# Patient Record
Sex: Male | Born: 1995 | Race: White | Hispanic: No | Marital: Single | Smoking: Never smoker
Health system: Southern US, Community
[De-identification: ages and names within clinical notes are randomized; demographics above are authoritative.]

## PROBLEM LIST (undated history)

## (undated) DIAGNOSIS — R51 Headache: Secondary | ICD-10-CM

## (undated) DIAGNOSIS — R519 Headache, unspecified: Secondary | ICD-10-CM

## (undated) DIAGNOSIS — L0591 Pilonidal cyst without abscess: Principal | ICD-10-CM

## (undated) DIAGNOSIS — K219 Gastro-esophageal reflux disease without esophagitis: Secondary | ICD-10-CM

## (undated) DIAGNOSIS — J301 Allergic rhinitis due to pollen: Secondary | ICD-10-CM

## (undated) DIAGNOSIS — Z87898 Personal history of other specified conditions: Secondary | ICD-10-CM

## (undated) HISTORY — DX: Allergic rhinitis due to pollen: J30.1

## (undated) HISTORY — DX: Personal history of other specified conditions: Z87.898

## (undated) HISTORY — DX: Gastro-esophageal reflux disease without esophagitis: K21.9

## (undated) HISTORY — DX: Pilonidal cyst without abscess: L05.91

## (undated) HISTORY — PX: TRACHEOSTOMY: SUR1362

## (undated) HISTORY — PX: BACK SURGERY: SHX140

---
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
## 2014-04-14 DIAGNOSIS — L0591 Pilonidal cyst without abscess: Secondary | ICD-10-CM

## 2014-04-14 HISTORY — DX: Pilonidal cyst without abscess: L05.91

## 2014-05-15 ENCOUNTER — Encounter: Payer: Self-pay | Admitting: *Deleted

## 2014-05-22 ENCOUNTER — Ambulatory Visit (INDEPENDENT_AMBULATORY_CARE_PROVIDER_SITE_OTHER): Payer: 59 | Admitting: General Surgery

## 2014-05-22 ENCOUNTER — Encounter: Payer: Self-pay | Admitting: General Surgery

## 2014-05-22 VITALS — BP 122/78 | HR 90 | Resp 15 | Ht 65.0 in | Wt 226.0 lb

## 2014-05-22 DIAGNOSIS — L0501 Pilonidal cyst with abscess: Secondary | ICD-10-CM | POA: Diagnosis not present

## 2014-05-22 MED ORDER — DOXYCYCLINE HYCLATE 100 MG PO CAPS: 100.0000 mg | ORAL_CAPSULE | Freq: Two times a day (BID) | ORAL | Status: DC

## 2014-05-22 NOTE — Progress Notes (Signed)
Patient ID: Samuel Miranda, male   DOB: 1996/03/26, 19 y.o.   MRN: [id number]  Chief Complaint  Patient presents with  . Other    Evaluation for cyst    HPI Samuel RightJeffrey C Schoenberger is a 19 y.o. male  Here for evaluation of Pilonidal cyst. He reports that this area keeps getting infected. He first noticed this about a year ago. He reports this is about the fifth time this has gotten infected. He reports this latest flair up occurred 2 weeks ago. He reports swelling about the size of a baseball and develops a head and drains. He reports that this drained for about 4 days with bloody discharge. He reports no fevers with this.  HPI  History reviewed. No pertinent past medical history.  Past Surgical History  Procedure Laterality Date  . Cystectomy  2012    Bladder tumor removed    Family History  Problem Relation Age of Onset  . Asthma Mother   . Diabetes Mother     Social History History  Substance Use Topics  . Smoking status: Never Smoker   . Smokeless tobacco: Never Used  . Alcohol Use: No    Allergies  Allergen Reactions  . Penicillins Shortness Of Breath and Rash  . Augmentin [Amoxicillin-Pot Clavulanate] Nausea And Vomiting  . Keflex [Cephalexin] Nausea And Vomiting    Current Outpatient Prescriptions  Medication Sig Dispense Refill  . acetaminophen (TYLENOL) 325 MG tablet Take 650 mg by mouth every 6 (six) hours as needed.    Marland Kitchen. ibuprofen (ADVIL,MOTRIN) 200 MG tablet Take 200 mg by mouth every 8 (eight) hours as needed.    . doxycycline (VIBRAMYCIN) 100 MG capsule Take 1 capsule (100 mg total) by mouth 2 (two) times daily. 20 capsule 0   No current facility-administered medications for this visit.    Review of Systems Review of Systems  Constitutional: Negative.   HENT: Negative.   Respiratory: Negative.     Blood pressure 122/78, pulse 90, resp. rate 15, height 5\' 5"  (1.651 m), weight 226 lb (102.513 kg).  Physical Exam Physical Exam  Constitutional: He is  oriented to person, place, and time. He appears well-developed and well-nourished.  Eyes: Conjunctivae are normal. No scleral icterus.  Neck: Neck supple. No thyromegaly present.  Cardiovascular: Normal rate, regular rhythm and normal heart sounds.   Pulmonary/Chest: Effort normal and breath sounds normal.  Abdominal: Soft. Bowel sounds are normal.  Lymphadenopathy:    He has no cervical adenopathy.  Neurological: He is alert and oriented to person, place, and time.  Skin: Skin is warm and dry.       Data Reviewed Note from urgent care  Assessment    Pilonidal sinus and abscess. Appears to be drained.     Plan    Treat with local care to include frequent cleansing and weekly clipping of hair. 10 day course of doxycycline. Reassess in 3-4 weeks. Briefly discussed surgery. Will defer this until summer when he is out of school.        Aleyah Balik G 05/22/2014, 12:03 PM

## 2014-05-22 NOTE — Patient Instructions (Signed)
Pilonidal Cyst A pilonidal cyst occurs when hairs get trapped (ingrown) beneath the skin in the crease between the buttocks over your sacrum (the bone under that crease). Pilonidal cysts are most common in young men with a lot of body hair. When the cyst is ruptured (breaks) or leaking, fluid from the cyst may cause burning and itching. If the cyst becomes infected, it causes a painful swelling filled with pus (abscess). The pus and trapped hairs need to be removed (often by lancing) so that the infection can heal. However, recurrence is common and an operation may be needed to remove the cyst. HOME CARE INSTRUCTIONS   If the cyst was NOT INFECTED:  Keep the area clean and dry. Bathe or shower daily. Wash the area well with a germ-killing soap. Warm tub baths may help prevent infection and help with drainage. Dry the area well with a towel.  Avoid tight clothing to keep area as moisture free as possible.  Keep area between buttocks as free of hair as possible. A depilatory may be used.  If the cyst WAS INFECTED and needed to be drained:  Your caregiver packed the wound with gauze to keep the wound open. This allows the wound to heal from the inside outwards and continue draining.  Return for a wound check in 1 day or as suggested.  If you take tub baths or showers, repack the wound with gauze following them. Sponge baths (at the sink) are a good alternative.  If an antibiotic was ordered to fight the infection, take as directed.  Only take over-the-counter or prescription medicines for pain, discomfort, or fever as directed by your caregiver.  After the drain is removed, use sitz baths for 20 minutes 4 times per day. Clean the wound gently with mild unscented soap, pat dry, and then apply a dry dressing. SEEK MEDICAL CARE IF:   You have increased pain, swelling, redness, drainage, or bleeding from the area.  You have a fever.  You have muscles aches, dizziness, or a general ill  feeling. Document Released: 03/28/2000 Document Revised: 06/23/2011 Document Reviewed: 05/26/2008 [employer] Patient Information 2015 [employer]. This information is not intended to replace advice given to you by your health care provider. Make sure you discuss any questions you have with your health care provider.  

## 2014-05-26 LAB — ANAEROBIC AND AEROBIC CULTURE

## 2014-06-13 ENCOUNTER — Ambulatory Visit: Payer: Self-pay | Admitting: Nurse Practitioner

## 2014-06-15 ENCOUNTER — Ambulatory Visit (INDEPENDENT_AMBULATORY_CARE_PROVIDER_SITE_OTHER): Payer: 59 | Admitting: General Surgery

## 2014-06-15 ENCOUNTER — Encounter: Payer: Self-pay | Admitting: General Surgery

## 2014-06-15 ENCOUNTER — Ambulatory Visit: Payer: 59 | Admitting: General Surgery

## 2014-06-15 VITALS — BP 126/78 | HR 92 | Resp 14 | Ht 65.0 in | Wt 229.0 lb

## 2014-06-15 DIAGNOSIS — L0591 Pilonidal cyst without abscess: Secondary | ICD-10-CM | POA: Diagnosis not present

## 2014-06-15 NOTE — Patient Instructions (Signed)
Pilonidal Cyst A pilonidal cyst occurs when hairs get trapped (ingrown) beneath the skin in the crease between the buttocks over your sacrum (the bone under that crease). Pilonidal cysts are most common in young men with a lot of body hair. When the cyst is ruptured (breaks) or leaking, fluid from the cyst may cause burning and itching. If the cyst becomes infected, it causes a painful swelling filled with pus (abscess). The pus and trapped hairs need to be removed (often by lancing) so that the infection can heal. However, recurrence is common and an operation may be needed to remove the cyst. HOME CARE INSTRUCTIONS   If the cyst was NOT INFECTED:  Keep the area clean and dry. Bathe or shower daily. Wash the area well with a germ-killing soap. Warm tub baths may help prevent infection and help with drainage. Dry the area well with a towel.  Avoid tight clothing to keep area as moisture free as possible.  Keep area between buttocks as free of hair as possible. A depilatory may be used.  If the cyst WAS INFECTED and needed to be drained:  Your caregiver packed the wound with gauze to keep the wound open. This allows the wound to heal from the inside outwards and continue draining.  Return for a wound check in 1 day or as suggested.  If you take tub baths or showers, repack the wound with gauze following them. Sponge baths (at the sink) are a good alternative.  If an antibiotic was ordered to fight the infection, take as directed.  Only take over-the-counter or prescription medicines for pain, discomfort, or fever as directed by your caregiver.  After the drain is removed, use sitz baths for 20 minutes 4 times per day. Clean the wound gently with mild unscented soap, pat dry, and then apply a dry dressing. SEEK MEDICAL CARE IF:   You have increased pain, swelling, redness, drainage, or bleeding from the area.  You have a fever.  You have muscles aches, dizziness, or a general ill  feeling. Document Released: 03/28/2000 Document Revised: 06/23/2011 Document Reviewed: 05/26/2008 [employer] Patient Information 2015 [employer]. This information is not intended to replace advice given to you by your health care provider. Make sure you discuss any questions you have with your health care provider.  

## 2014-06-15 NOTE — Progress Notes (Signed)
He is here today for follow up pilonidal cyst. He states the area is better. No drainage and no pain.  Previous abscess has resolved, large skin opening in middle had several tufts of hair removed.  No sign of redness or induration. Follow up in one month. Continue clipping hair around pilonidal area.

## 2014-06-16 ENCOUNTER — Encounter: Payer: Self-pay | Admitting: Nurse Practitioner

## 2014-06-16 ENCOUNTER — Ambulatory Visit (INDEPENDENT_AMBULATORY_CARE_PROVIDER_SITE_OTHER): Payer: 59 | Admitting: Nurse Practitioner

## 2014-06-16 VITALS — BP 118/78 | HR 84 | Temp 98.4°F | Resp 12 | Ht 65.0 in | Wt 231.1 lb

## 2014-06-16 DIAGNOSIS — Z7189 Other specified counseling: Secondary | ICD-10-CM

## 2014-06-16 DIAGNOSIS — L0591 Pilonidal cyst without abscess: Secondary | ICD-10-CM

## 2014-06-16 DIAGNOSIS — Z7689 Persons encountering health services in other specified circumstances: Secondary | ICD-10-CM

## 2014-06-16 NOTE — Progress Notes (Signed)
Pre visit review using our clinic review tool, if applicable. No additional management support is needed unless otherwise documented below in the visit note. 

## 2014-06-16 NOTE — Progress Notes (Signed)
Subjective:    Patient ID: Samuel Miranda, male    DOB: 1995-04-18, 18 y.o.   MRN: [id number]  HPI  Samuel Miranda is a 19 yo male establishing care today. Patient is accompanied by his mother who is the primary historian.   1) Health Maintenance-   Diet- Eats at home often   Exercise- No formal   Immunizations- Refuses flu   Eye Exam- 2014 November, Appointment soon  Dental Exam- Not UTD  2) Chronic Problems-  Obesity- Plans to start March 17th   Exercise- 1 x daily for 15 min and work way up at home   Food- Cutting back on portions     Allergies- Zyrtec and Benadryl helpful   Pilonidal Cyst- Dr. Evette Cristal   Cystostoscopy- 2014   3) Acute Problems-  Started having Pilonidal cysts about a year and half ago. Recur every few months. Saw Dr. Evette Cristal yesterday for removal of hair tuft in pilonidal cyst. Pt's mother is wondering why surgery is not an option.    Review of Systems  Constitutional: Negative for fever, chills, diaphoresis and fatigue.  HENT: Negative for congestion, ear discharge, ear pain, postnasal drip, rhinorrhea, sinus pressure, sneezing, sore throat, tinnitus, trouble swallowing and voice change.   Eyes: Negative for visual disturbance.  Respiratory: Negative for chest tightness, shortness of breath and wheezing.   Cardiovascular: Negative for chest pain, palpitations and leg swelling.  Gastrointestinal: Negative for nausea, vomiting, diarrhea and rectal pain.  Skin: Negative for rash.  Neurological: Negative for dizziness, weakness and numbness.  Psychiatric/Behavioral: The patient is not nervous/anxious.    Past Medical History  Diagnosis Date  . Pilonidal cyst 2016  . Hay fever   . GERD (gastroesophageal reflux disease)     As a child  . History of learning disability     From childhood events- low oxygenation for short periods of time    History   Social History  . Marital Status: Single    Spouse Name: N/A  . Number of Children: N/A  . Years of  Education: N/A   Occupational History  . Not on file.   Social History Main Topics  . Smoking status: Never Smoker   . Smokeless tobacco: Never Used  . Alcohol Use: No  . Drug Use: No  . Sexual Activity: No   Other Topics Concern  . 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 Cystectomy  2014    cyst Removed from bladder    Family History  Problem Relation Age of Onset  . Asthma Mother   . Diabetes Mother   . Hyperlipidemia Mother   . Hypertension Mother   . Alcohol abuse Father   . Alcohol abuse Maternal Grandmother   . COPD Maternal Grandmother   . Cancer Maternal Aunt 51    Breast Cancer  . Diabetes Maternal Uncle   . COPD Maternal Grandfather   . Hypertension Maternal Grandfather   . Diabetes Paternal Grandmother   . Stroke Paternal Grandmother   . Heart disease Paternal Grandfather   . COPD Paternal Grandfather     Allergies  Allergen Reactions  .  Penicillins Shortness Of Breath and Rash  . Augmentin [Amoxicillin-Pot Clavulanate] Nausea And Vomiting  . Keflex [Cephalexin] Nausea And Vomiting    Current Outpatient Prescriptions on File Prior to Visit  Medication Sig Dispense Refill  . acetaminophen (TYLENOL) 325 MG tablet Take 650 mg by mouth every 6 (six) hours as needed.    Marland Kitchen. ibuprofen (ADVIL,MOTRIN) 200 MG tablet Take 200 mg by mouth every 8 (eight) hours as needed.     No current facility-administered medications on file prior to visit.      Objective:   Physical Exam  Constitutional: He is oriented to person, place, and time. He appears well-developed and well-nourished. No distress.  BP 118/78 mmHg  Pulse 84  Temp(Src) 98.4 F (36.9 C) (Oral)  Resp 12  Ht 5\' 5"   (1.651 m)  Wt 231 lb 1.9 oz (104.835 kg)  BMI 38.46 kg/m2  SpO2 97%   HENT:  Head: Normocephalic and atraumatic.  Right Ear: External ear normal.  Left Ear: External ear normal.  Eyes: Right eye exhibits no discharge. Left eye exhibits no discharge. No scleral icterus.  Cardiovascular: Normal rate, regular rhythm, normal heart sounds and intact distal pulses.  Exam reveals no gallop and no friction rub.   No murmur heard. Pulmonary/Chest: Effort normal and breath sounds normal. No respiratory distress. He has no wheezes. He has no rales. He exhibits no tenderness.  Genitourinary:  Just saw Samuel Miranda yesterday- deferred evaluation  Neurological: He is alert and oriented to person, place, and time.  Skin: Skin is warm and dry. No rash noted. He is not diaphoretic.  Psychiatric: He has a normal mood and affect.  Pt has a learning disability.       Assessment & Plan:

## 2014-06-16 NOTE — Patient Instructions (Addendum)
Continue to follow up with Dr. Evette CristalSankar next month.   Welcome to [facility]!

## 2014-06-21 DIAGNOSIS — L0591 Pilonidal cyst without abscess: Secondary | ICD-10-CM | POA: Insufficient documentation

## 2014-06-21 DIAGNOSIS — Z7689 Persons encountering health services in other specified circumstances: Secondary | ICD-10-CM | POA: Insufficient documentation

## 2014-06-21 NOTE — Assessment & Plan Note (Signed)
Discussed acute and chronic issues. Reviewed health maintenance measures, PFSHx, and immunizations. Obtain records from past facility.

## 2014-06-21 NOTE — Assessment & Plan Note (Signed)
Pt sees Dr. Evette CristalSankar for care regarding the pilonidal cyst. The aerobic anaerobic culture came back negative for infection in Feb. 2016. The mother is concerned about having it surgically "fixed" so that her son will not have to miss work. I explained that it was not infected and that Dr. Luan MooreSankar's instructions were to keep the area clean and hair free, which will keep the site from becoming inflamed once again. She was agreeable to this and they will continue to follow up as requested.

## 2014-07-19 ENCOUNTER — Ambulatory Visit: Payer: 59 | Admitting: General Surgery

## 2014-10-27 ENCOUNTER — Telehealth: Payer: Self-pay | Admitting: Nurse Practitioner

## 2014-10-27 NOTE — Telephone Encounter (Signed)
Pt mother- Samuel Miranda called to advise that pt has a cyst at his tailbone that is leaking. Please advise pt.msn

## 2014-10-27 NOTE — Telephone Encounter (Signed)
Informed grandmother that he had to go to the general surgeon's office first

## 2014-10-27 NOTE — Telephone Encounter (Signed)
He will need to call his general surgeon's office first.

## 2014-11-30 ENCOUNTER — Emergency Department
Admission: EM | Admit: 2014-11-30 | Discharge: 2014-11-30 | Disposition: A | Discharge status: Home / Self Care | Payer: 59 | Attending: Student | Admitting: Student

## 2014-11-30 ENCOUNTER — Encounter: Payer: Self-pay | Admitting: Emergency Medicine

## 2014-11-30 DIAGNOSIS — L0501 Pilonidal cyst with abscess: Secondary | ICD-10-CM | POA: Insufficient documentation

## 2014-11-30 DIAGNOSIS — Z880 Allergy status to penicillin: Secondary | ICD-10-CM | POA: Diagnosis not present

## 2014-11-30 DIAGNOSIS — L02212 Cutaneous abscess of back [any part, except buttock]: Secondary | ICD-10-CM | POA: Diagnosis present

## 2014-11-30 DIAGNOSIS — Z79899 Other long term (current) drug therapy: Secondary | ICD-10-CM | POA: Insufficient documentation

## 2014-11-30 MED ORDER — SULFAMETHOXAZOLE-TRIMETHOPRIM 800-160 MG PO TABS: 1.0000 | ORAL_TABLET | Freq: Two times a day (BID) | ORAL | Status: DC

## 2014-11-30 MED ORDER — OXYCODONE-ACETAMINOPHEN 5-325 MG PO TABS
2.0000 | ORAL_TABLET | Freq: Once | ORAL | Status: AC
  Administered 2014-11-30: 2 via ORAL
  Filled 2014-11-30: qty 2

## 2014-11-30 MED ORDER — OXYCODONE-ACETAMINOPHEN 5-325 MG PO TABS: 1.0000 | ORAL_TABLET | ORAL | Status: DC | PRN

## 2014-11-30 MED ORDER — LIDOCAINE-EPINEPHRINE (PF) 1 %-1:200000 IJ SOLN
INTRAMUSCULAR | Status: AC
  Filled 2014-11-30: qty 30

## 2014-11-30 MED ORDER — LIDOCAINE HCL (PF) 1 % IJ SOLN
5.0000 mL | Freq: Once | INTRAMUSCULAR | Status: AC
  Administered 2014-11-30: 5 mL
  Filled 2014-11-30: qty 5

## 2014-11-30 NOTE — ED Notes (Signed)
Patient reports abscess to lower back.

## 2014-11-30 NOTE — Discharge Instructions (Signed)
° ° °  RETURN TO ER IN 2 DAYS FOR REMOVAL OF PACKING PERCOCET FOR PAIN AS NEEDED SEPTRA DS FOR INFECTION FOR 10 DAYS CALL FOR AN APPOINTMENT WITH THE SURGEON LISTED ON YOUR DISCHARGE PAPERS

## 2014-11-30 NOTE — ED Provider Notes (Signed)
[facility] Emergency Department Provider Note  ____________________________________________  Time seen: Approximately 11:35 AM  I have reviewed the triage vital signs and the nursing notes.   HISTORY  Chief Complaint Abscess   HPI Samuel Miranda is a 19 y.o. male is here with complaint of abscess to lower back.He states that this is the eighth time he has had an abscess in this area. It has been cultured twice and was negative for MRSA. She denies any fever at home. Patient states that mother drained it this morning and he was able to sleep. Mother states that she got approximately 1/2 cup of drainage from the area since it has a hole in it. Patient states that he drained even more after that. He continues to have pain in this area. Mother would like to get a referral to a surgeon for a second opinion. They were at his surgeon's office in the past who was reluctant to remove the cyst that he continues to have more infections. Currently he states his pain is 4 out of 10. Pain increases if he is trying to sit.   Past Medical History  Diagnosis Date  . Pilonidal cyst 2016  . Hay fever   . GERD (gastroesophageal reflux disease)     As a child  . History of learning disability     From childhood events- low oxygenation for short periods of time    Patient Active Problem List   Diagnosis Date Noted  . Encounter to establish care 06/21/2014  . Pilonidal cyst without infection 06/21/2014    Past Surgical History  Procedure Laterality Date  . 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 Cystectomy  2014    cyst Removed from bladder    Current Outpatient Rx  Name  Route  Sig  Dispense  Refill  . acetaminophen (TYLENOL) 325 MG tablet   Oral   Take 650 mg by mouth every 6 (six) hours as needed.         Marland Kitchen ibuprofen (ADVIL,MOTRIN) 200 MG tablet   Oral   Take 200 mg by mouth every 8 (eight) hours as needed.         Marland Kitchen oxyCODONE-acetaminophen (PERCOCET)  5-325 MG per tablet   Oral   Take 1 tablet by mouth every 4 (four) hours as needed for severe pain.   20 tablet   0   . sulfamethoxazole-trimethoprim (BACTRIM DS,SEPTRA DS) 800-160 MG per tablet   Oral   Take 1 tablet by mouth 2 (two) times daily.   20 tablet   0     Allergies Penicillins; Augmentin; and Keflex  Family History  Problem Relation Age of Onset  . Asthma Mother   . Diabetes Mother   . Hyperlipidemia Mother   . Hypertension Mother   . Alcohol abuse Father   . Alcohol abuse Maternal Grandmother   . COPD Maternal Grandmother   . Cancer Maternal Aunt 22    Breast Cancer  . Diabetes Maternal Uncle   . COPD Maternal Grandfather   . Hypertension Maternal Grandfather   . Diabetes Paternal Grandmother   . Stroke Paternal Grandmother   . Heart disease Paternal Grandfather   . COPD Paternal Grandfather     Social History Social History  Substance Use Topics  . Smoking status: Never Smoker   . Smokeless tobacco: Never Used  . Alcohol Use: No    Review of Systems Constitutional: No fever/chills Eyes: No visual changes. ENT: No  sore throat. Cardiovascular: Denies chest pain. Respiratory: Denies shortness of breath. Gastrointestinal: No abdominal pain.  No nausea, no vomiting.  No diarrhea.  No constipation. Genitourinary: Negative for dysuria. Musculoskeletal: Negative for back pain. Skin: Negative for rash. Positive for multiple abscesses Neurological: Negative for headaches, focal weakness or numbness.  10-point ROS otherwise negative.  ____________________________________________   PHYSICAL EXAM:  VITAL SIGNS: ED Triage Vitals  Enc Vitals Group     BP 11/30/14 1030 140/90 mmHg     Pulse Rate 11/30/14 1030 102     Resp 11/30/14 1030 20     Temp 11/30/14 1030 98.5 F (36.9 C)     Temp Source 11/30/14 1030 Oral     SpO2 11/30/14 1030 98 %     Weight 11/30/14 1030 235 lb (106.595 kg)     Height 11/30/14 1030 5\' 5"  (1.651 m)     Head Cir --       Peak Flow --      Pain Score 11/30/14 1031 4     Pain Loc --      Pain Edu? --      Excl. in GC? --     Constitutional: Alert and oriented. Well appearing and in no acute distress. Eyes: Conjunctivae are normal. PERRL. EOMI. Head: Atraumatic. Nose: No congestion/rhinnorhea. Neck: No stridor.   Cardiovascular: Normal rate, regular rhythm. Grossly normal heart sounds.  Good peripheral circulation. Respiratory: Normal respiratory effort.  No retractions. Lungs CTAB. Gastrointestinal: Soft and nontender. No distention Musculoskeletal: No lower extremity tenderness nor edema.  No joint effusions. Neurologic:  Normal speech and language. No gross focal neurologic deficits are appreciated. No gait instability. Skin:  Skin is warm, dry and intact. There is a large, mild cyst with erythema present. Moderate tenderness on palpation. There is 2 areas for which there is moderate drainage. Psychiatric: Mood and affect are normal. Speech and behavior are normal.  ____________________________________________   LABS (all labs ordered are listed, but only abnormal results are displayed)  Labs Reviewed - No data to display  PROCEDURES  Procedure(s) performed: INCISION AND DRAINAGE Performed by: Tommi Rumps Consent: Verbal consent obtained. Risks and benefits: risks, benefits and alternatives were discussed Type: abscess  Body area: Perirectal/pilonidal  Anesthesia: local infiltration  Incision was made with a scalpel.  Local anesthetic: lidocaine and % without epinephrine  Anesthetic total: 4.0 ml  Complexity: complex Blunt dissection to break up loculations  Drainage: purulent   Drainage amount: Small amount   Packing material: 1/4 in iodoform gauze  Patient tolerance: Patient tolerated the procedure well with no immediate complications.    Critical Care performed: No  ____________________________________________   INITIAL IMPRESSION / ASSESSMENT AND PLAN / ED  COURSE  Pertinent labs & imaging results that were available during my care of the patient were reviewed by me and considered in my medical decision making (see chart for detaila    Patient was started on Septra DS for 10 days and prescription for Percocet as needed for pain. Mother was given the name of the surgeon on-call for follow-up and hopefully for excision of the cyst as this is numerous infections. She is return in 2 days with patient for packing removal.    FINAL CLINICAL IMPRESSION(S) / ED DIAGNOSES  Final diagnoses:  Pilonidal cyst with abscess      Tommi Rumps, PA-C 11/30/14 1649  Gayla Doss, MD 11/30/14 1714

## 2014-12-02 ENCOUNTER — Emergency Department
Admission: EM | Admit: 2014-12-02 | Discharge: 2014-12-02 | Disposition: A | Discharge status: Home / Self Care | Payer: 59 | Attending: Emergency Medicine | Admitting: Emergency Medicine

## 2014-12-02 ENCOUNTER — Encounter: Payer: Self-pay | Admitting: Emergency Medicine

## 2014-12-02 DIAGNOSIS — Z880 Allergy status to penicillin: Secondary | ICD-10-CM | POA: Insufficient documentation

## 2014-12-02 DIAGNOSIS — Z48817 Encounter for surgical aftercare following surgery on the skin and subcutaneous tissue: Secondary | ICD-10-CM | POA: Diagnosis present

## 2014-12-02 DIAGNOSIS — Z79899 Other long term (current) drug therapy: Secondary | ICD-10-CM | POA: Diagnosis not present

## 2014-12-02 DIAGNOSIS — Z5189 Encounter for other specified aftercare: Secondary | ICD-10-CM

## 2014-12-02 NOTE — ED Notes (Signed)
Pt here for packing removal. Seen previously for abscess on bottom.

## 2014-12-02 NOTE — Discharge Instructions (Signed)
Wound Check Your wound appears healthy today. Your wound will heal gradually over time. Eventually a scar will form that will fade with time. FACTORS THAT AFFECT SCAR FORMATION:  People differ in the severity in which they scar.  Scar severity varies according to location, size, and the traits you inherited from your parents (genetic predisposition).  Irritation to the wound from infection, rubbing, or chemical exposure will increase the amount of scar formation. HOME CARE INSTRUCTIONS   If you were given a dressing, you should change it at least once a day or as instructed by your caregiver. If the bandage sticks, soak it off with a solution of hydrogen peroxide.  If the bandage becomes wet, dirty, or develops a bad smell, change it as soon as possible.  Look for signs of infection.  Only take over-the-counter or prescription medicines for pain, discomfort, or fever as directed by your caregiver. SEEK IMMEDIATE MEDICAL CARE IF:   You have redness, swelling, or increasing pain in the wound.  You notice pus coming from the wound.  You have a fever.  You notice a bad smell coming from the wound or dressing. Document Released: 01/05/2004 Document Revised: 06/23/2011 Document Reviewed: 03/31/2005 [facility] Patient Information 2015 [place], [place]. This information is not intended to replace advice given to you by your health care provider. Make sure you discuss any questions you have with your health care provider.   Continue antibiotic and pain medicine.  See the surgeon as scheduled

## 2014-12-02 NOTE — ED Provider Notes (Signed)
[facility] Emergency Department Provider Note ?____________________________________________ ? Time seen: 1650 ? I have reviewed the triage vital signs and the nursing notes. ________ HISTORY ? Chief Complaint Wound Check  HPI  Samuel Miranda is a 19 y.o. male reports to the ED for wound check status post a recent visit for incision and drainage of a pilonidal cyst abscess. He reports that his symptoms have improved and his pain has been decreased since the visit. He has tolerated his Bactrim as well as his Vicodin pain medicines. He scheduled to see the general surgeon on Wednesday.  Past Medical History  Diagnosis Date  . Pilonidal cyst 2016  . Hay fever   . GERD (gastroesophageal reflux disease)     As a child  . History of learning disability     From childhood events- low oxygenation for short periods of time    Patient Active Problem List   Diagnosis Date Noted  . Encounter to establish care 06/21/2014  . Pilonidal cyst without infection 06/21/2014   ? Past Surgical History  Procedure Laterality Date  . 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 Cystectomy  2014    cyst Removed from bladder   ? Current Outpatient Rx  Name  Route  Sig  Dispense  Refill  . acetaminophen (TYLENOL) 325 MG tablet   Oral   Take 650 mg by mouth every 6 (six) hours as needed.         Marland Kitchen ibuprofen (ADVIL,MOTRIN) 200 MG tablet   Oral   Take 200 mg by mouth every 8 (eight) hours as needed.         Marland Kitchen oxyCODONE-acetaminophen (PERCOCET) 5-325 MG per tablet   Oral   Take 1 tablet by mouth every 4 (four) hours as needed for severe pain.   20 tablet   0   . sulfamethoxazole-trimethoprim (BACTRIM DS,SEPTRA DS) 800-160 MG per tablet   Oral   Take 1 tablet by mouth 2 (two) times daily.   20 tablet   0   ? Allergies Penicillins; Augmentin; and Keflex ? Family History  Problem Relation Age of Onset  . Asthma Mother   . Diabetes Mother   . Hyperlipidemia  Mother   . Hypertension Mother   . Alcohol abuse Father   . Alcohol abuse Maternal Grandmother   . COPD Maternal Grandmother   . Cancer Maternal Aunt 88    Breast Cancer  . Diabetes Maternal Uncle   . COPD Maternal Grandfather   . Hypertension Maternal Grandfather   . Diabetes Paternal Grandmother   . Stroke Paternal Grandmother   . Heart disease Paternal Grandfather   . COPD Paternal Grandfather    ?Social History Social History  Substance Use Topics  . Smoking status: Never Smoker   . Smokeless tobacco: Never Used  . Alcohol Use: No   Review of Systems Constitutional: Negative for fever. HEENT:  Normocephalic/atraumatic. Negative for visual/hearingchanges, sore throat, or nasal congestion. Cardiovascular: Negative for chest pain. Respiratory: Negative for shortness of breath. Musculoskeletal: Negative for back pain. Skin: Reports healing abscess Neurological: Negative for headaches, focal weakness or numbness. Hematological/Lymphatic:Negative for enlarged lymph nodes  10-point ROS otherwise negative. ____________________________________________  PHYSICAL EXAM:  VITAL SIGNS: ED Triage Vitals  Enc Vitals Group     BP 12/02/14 1533 140/80 mmHg     Pulse Rate 12/02/14 1533 101     Resp --      Temp 12/02/14 1533 98.2 F (36.8 C)  Temp Source 12/02/14 1533 Oral     SpO2 12/02/14 1533 100 %     Weight 12/02/14 1533 235 lb (106.595 kg)     Height 12/02/14 1533  (1.651 m)     Head Cir --      Peak Flow --      Pain Score 12/02/14 1534 0     Pain Loc --      Pain Edu? --      Excl. in GC? --    Constitutional: Alert and oriented. Well appearing and in no distress. HEENT: Normocephalic and atraumatic.Conjunctivae are normal. PERRL. Normal extraocular movements. Mucous membranes are moist. Hematological/Lymphatic/Immunilogical: No cervical lymphadenopathy. Cardiovascular: Normal rate, regular rhythm.No murmurs, rubs, or gallops.  Respiratory: Normal  respiratory effort.  Gastrointestinal: Soft and nontender. No distention.  Musculoskeletal: Nontender with normal range of motion in all extremities.  Neurologic:  Normal speech and language. No gross focal neurologic deficits are appreciated.  Skin:  Well-healing abscess at the top of the gluteal cleft, with sterile packing in place. Wound packing is removed without difficulty.  Psychiatric: Mood and affect are normal. Speech and behavior are normal. Patient exhibits appropriate insight and judgment.  ______________________________________________________ INITIAL IMPRESSION / ASSESSMENT AND PLAN / ED COURSE ? Well-healing pilonidal cyst abscess s/p I&D. Dry dressing applied. Continue meds as prescribed. See general surgeon as scheduled. Return as needed.  ____________________________________________ FINAL CLINICAL IMPRESSION(S) / ED DIAGNOSES?  Final diagnoses:  Wound check, abscess      Lissa Hoard, PA-C 12/03/14 2210  Jene Every, MD 12/04/14 1316

## 2014-12-06 ENCOUNTER — Encounter: Payer: Self-pay | Admitting: Surgery

## 2014-12-06 ENCOUNTER — Ambulatory Visit (INDEPENDENT_AMBULATORY_CARE_PROVIDER_SITE_OTHER): Payer: 59 | Admitting: Surgery

## 2014-12-06 VITALS — BP 140/81 | HR 117 | Temp 98.3°F | Ht 65.0 in | Wt 233.0 lb

## 2014-12-06 DIAGNOSIS — L0591 Pilonidal cyst without abscess: Secondary | ICD-10-CM

## 2014-12-06 NOTE — Patient Instructions (Signed)
Follow-up in 4 weeks. Please make an appointment at the front desk.

## 2014-12-06 NOTE — Progress Notes (Signed)
Patient ID: Samuel Miranda, male   DOB: 01/11/1996, 19 y.o.   MRN: [id number]  Chief Complaint  Patient presents with  . Cyst    pilonidal cyst removal surgical consult    HPI  Samuel Miranda is a 19 y.o. male.  Who is referred by urgent care with a 3 year history of multiple episodes of pilonidal cyst abscess requiring multiple trips to the emergency room urgent care for incision and drainage. Quite tired of this process. He has finished college. Patient does not smoke. Currently he is feeling better following the most recent incision and drainage performed on 11/30/2014.  He is accompanied today by his mother.  Past Medical History  Diagnosis Date  . Pilonidal cyst 2016  . Hay fever   . GERD (gastroesophageal reflux disease)     As a child  . History of learning disability     From childhood events- low oxygenation for short periods of time    Past Surgical History  Procedure Laterality Date  . 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 Cystectomy  2014    cyst Removed from bladder    Family History  Problem Relation Age of Onset  . Asthma Mother   . Diabetes Mother   . Hyperlipidemia Mother   . Hypertension Mother   . Alcohol abuse Father   . Alcohol abuse Maternal Grandmother   . COPD Maternal Grandmother   . Cancer Maternal Aunt 59    Breast Cancer  . Diabetes Maternal Uncle   . COPD Maternal Grandfather   . Hypertension Maternal Grandfather   . Diabetes Paternal Grandmother   . Stroke Paternal Grandmother   . Heart disease Paternal Grandfather   . COPD Paternal Grandfather     Social History Social History  Substance Use Topics  . Smoking status: Never Smoker   . Smokeless tobacco: Never Used  . Alcohol Use: No    Allergies  Allergen Reactions  . Penicillins Shortness Of Breath and Rash  . Augmentin [Amoxicillin-Pot Clavulanate] Nausea And Vomiting  . Keflex [Cephalexin] Nausea And Vomiting    Current Outpatient Prescriptions  Medication Sig  Dispense Refill  . acetaminophen (TYLENOL) 325 MG tablet Take 650 mg by mouth every 6 (six) hours as needed.    Marland Kitchen ibuprofen (ADVIL,MOTRIN) 200 MG tablet Take 200 mg by mouth every 8 (eight) hours as needed.    Marland Kitchen oxyCODONE-acetaminophen (PERCOCET) 5-325 MG per tablet Take 1 tablet by mouth every 4 (four) hours as needed for severe pain. 20 tablet 0  . sulfamethoxazole-trimethoprim (BACTRIM DS,SEPTRA DS) 800-160 MG per tablet Take 1 tablet by mouth 2 (two) times daily. 20 tablet 0   No current facility-administered medications for this visit.    Blood pressure 140/81, pulse 117, temperature 98.3 F (36.8 C), temperature source Oral, height  (1.651 m), weight 233 lb (105.688 kg).  Review of Systems  Constitutional: Negative for fever, chills and weight loss.  Gastrointestinal: Negative.   Genitourinary: Negative.   Musculoskeletal: Negative.   All other systems reviewed and are negative.   Physical Exam  Constitutional: He is oriented to person, place, and time and well-developed, well-nourished, and in no distress. Vital signs are normal. No distress.  Cardiovascular: Normal rate and regular rhythm.   Pulmonary/Chest: Effort normal and breath sounds normal.  Abdominal: Soft.  Neurological: He is oriented to person, place, and time.  Skin: Skin is warm and dry. He is not diaphoretic.     Psychiatric:  Mood, memory, affect and judgment normal.     Data Reviewed Outside records were reviewed.  Assessment    Recurrent pilonidal abscess.    Plan    He is quite tired of this problem. I discussed with him surgical intervention in the near future. Him and his mother were agreeable. I like to see him back in 3-4 weeks' time after the induration swelling has reduced to discuss the best surgical approach.     Natale Lay MD, FACS 12/06/2014, 2:10 PM

## 2015-01-10 ENCOUNTER — Ambulatory Visit: Payer: Self-pay | Admitting: Surgery

## 2015-01-31 ENCOUNTER — Ambulatory Visit: Payer: 59 | Admitting: Surgery

## 2015-05-05 ENCOUNTER — Encounter: Payer: Self-pay | Admitting: Emergency Medicine

## 2015-05-05 ENCOUNTER — Ambulatory Visit
Admission: EM | Admit: 2015-05-05 | Discharge: 2015-05-05 | Disposition: A | Discharge status: Home / Self Care | Payer: 59 | Attending: Family Medicine | Admitting: Family Medicine

## 2015-05-05 DIAGNOSIS — L0501 Pilonidal cyst with abscess: Secondary | ICD-10-CM | POA: Diagnosis not present

## 2015-05-05 MED ORDER — SULFAMETHOXAZOLE-TRIMETHOPRIM 800-160 MG PO TABS: 1.0000 | ORAL_TABLET | Freq: Two times a day (BID) | ORAL | Status: AC

## 2015-05-05 MED ORDER — OXYCODONE-ACETAMINOPHEN 5-325 MG PO TABS: 1.0000 | ORAL_TABLET | Freq: Three times a day (TID) | ORAL | Status: DC | PRN

## 2015-05-05 NOTE — ED Notes (Signed)
Patient has an abscess above his buttock off and on for 2 years.  Patient denies fevers.

## 2015-05-05 NOTE — ED Provider Notes (Signed)
CSN: [id number]     Arrival date & time 05/05/15  1122 History   First MD Initiated Contact with Patient 05/05/15 1203     Chief Complaint  Patient presents with  . Abscess   (Consider location/radiation/quality/duration/timing/severity/associated sxs/prior Treatment) HPI Patient resents today with mother with history of pilonidal cyst/abscess. Patient states that he has been dealing with flares of this for the last couple years. He denies any fevers. He denies any extension to the anal area or genital area. He has been doing sitz baths and also warm compresses on the area. It did start to drain yesterday. There was some pus and blood that has started to drain from the area. He has seen surgeons in the past but has not had surgery of the site yet. He states his pain is better since the area has started to drain.  Past Medical History  Diagnosis Date  . Pilonidal cyst 2016  . Hay fever   . GERD (gastroesophageal reflux disease)     As a child  . History of learning disability     From childhood events- low oxygenation for short periods of time   Past Surgical History  Procedure Laterality Date  . 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 Cystectomy  2014    cyst Removed from bladder   Family History  Problem Relation Age of Onset  . Asthma Mother   . Diabetes Mother   . Hyperlipidemia Mother   . Hypertension Mother   . Alcohol abuse Father   . Alcohol abuse Maternal Grandmother   . COPD Maternal Grandmother   . Cancer Maternal Aunt 42    Breast Cancer  . Diabetes Maternal Uncle   . COPD Maternal Grandfather   . Hypertension Maternal Grandfather   . Diabetes Paternal Grandmother   . Stroke Paternal Grandmother   . Heart disease Paternal Grandfather   . COPD Paternal Grandfather    Social History  Substance Use Topics  . Smoking status: Never Smoker   . Smokeless tobacco: Never Used  . Alcohol Use: No    Review of Systems: Negative except mentioned above.    Allergies  Penicillins; Augmentin; and Keflex  Home Medications   Prior to Admission medications   Medication Sig Start Date End Date Taking? Authorizing Provider  acetaminophen (TYLENOL) 325 MG tablet Take 650 mg by mouth every 6 (six) hours as needed.    Historical Provider, MD  ibuprofen (ADVIL,MOTRIN) 200 MG tablet Take 200 mg by mouth every 8 (eight) hours as needed.    Historical Provider, MD  oxyCODONE-acetaminophen (PERCOCET/ROXICET) 5-325 MG tablet Take 1 tablet by mouth every 8 (eight) hours as needed for moderate pain or severe pain. 05/05/15   Jolene Provost, MD  sulfamethoxazole-trimethoprim (BACTRIM DS,SEPTRA DS) 800-160 MG tablet Take 1 tablet by mouth 2 (two) times daily. 05/05/15 05/12/15  Jolene Provost, MD   Meds Ordered and Administered this Visit  Medications - No data to display  BP 128/69 mmHg  Pulse 100  Temp(Src) 98.4 F (36.9 C) (Tympanic)  Resp 16  Ht  (1.676 m)  Wt 236 lb (107.049 kg)  BMI 38.11 kg/m2  SpO2 99% No data found.   Physical Exam  GENERAL: mild discomfort laying on exam table  RESP: CTA B CARD: RRR SKIN:  golf ball sized pilonidal cyst/abscess, there is blood draining from the distal aspect of the area, the area is fluctuant and mildly tender  ED Course  Procedures (including critical care  time)   MDM   1. Pilonidal cyst with abscess   Sterile technique was used and 1/4 inch packing was placed where the area has already started draining, dressing was placed over the area, I have asked that he keep the area clean and dry and use light massage and warm compresses to continue to express the site, he should follow-up here in 2 days or with the general surgeon. I have started him on Bactrim DS for 10 days. He is given Percocet for moderate/severe pain.    Jolene Provost, MD 05/05/15 1252

## 2015-05-20 ENCOUNTER — Encounter: Payer: Self-pay | Admitting: Gynecology

## 2015-05-20 ENCOUNTER — Ambulatory Visit
Admission: EM | Admit: 2015-05-20 | Discharge: 2015-05-20 | Disposition: A | Discharge status: Home / Self Care | Payer: 59 | Attending: Family Medicine | Admitting: Family Medicine

## 2015-05-20 DIAGNOSIS — L0591 Pilonidal cyst without abscess: Secondary | ICD-10-CM

## 2015-05-20 MED ORDER — SULFAMETHOXAZOLE-TRIMETHOPRIM 800-160 MG PO TABS: 1.0000 | ORAL_TABLET | Freq: Two times a day (BID) | ORAL | Status: AC

## 2015-05-20 NOTE — ED Provider Notes (Signed)
CSN: [id number]     Arrival date & time 05/20/15  1426 History   First MD Initiated Contact with Patient 05/20/15 1600     Chief Complaint  Patient presents with  . Cyst   (Consider location/radiation/quality/duration/timing/severity/associated sxs/prior Treatment) HPI: Patient presents today with persistent pilonidal cyst/abscess. The area was drained and packed on 05/05/15. The patient did not follow-up here for repacking as instructed. He kept the packing in for a few days and then his mother removed the packing. He continued to take the antibiotic and states that the area did improve but now continues to still drain and he is finished with the antibiotic course. He denies any fever or any other lesions that are similar anywhere else.  Past Medical History  Diagnosis Date  . Pilonidal cyst 2016  . Hay fever   . GERD (gastroesophageal reflux disease)     As a child  . History of learning disability     From childhood events- low oxygenation for short periods of time   Past Surgical History  Procedure Laterality Date  . 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 Cystectomy  2014    cyst Removed from bladder   Family History  Problem Relation Age of Onset  . Asthma Mother   . Diabetes Mother   . Hyperlipidemia Mother   . Hypertension Mother   . Alcohol abuse Father   . Alcohol abuse Maternal Grandmother   . COPD Maternal Grandmother   . Cancer Maternal Aunt 39    Breast Cancer  . Diabetes Maternal Uncle   . COPD Maternal Grandfather   . Hypertension Maternal Grandfather   . Diabetes Paternal Grandmother   . Stroke Paternal Grandmother   . Heart disease Paternal Grandfather   . COPD Paternal Grandfather    Social History  Substance Use Topics  . Smoking status: Never Smoker   . Smokeless tobacco: Never Used  . Alcohol Use: No    Review of Systems: Negative except mentioned above  Allergies  Penicillins; Augmentin; and Keflex  Home Medications   Prior to  Admission medications   Medication Sig Start Date End Date Taking? Authorizing Provider  acetaminophen (TYLENOL) 325 MG tablet Take 650 mg by mouth every 6 (six) hours as needed.   Yes Historical Provider, MD  ibuprofen (ADVIL,MOTRIN) 200 MG tablet Take 200 mg by mouth every 8 (eight) hours as needed.   Yes Historical Provider, MD  oxyCODONE-acetaminophen (PERCOCET/ROXICET) 5-325 MG tablet Take 1 tablet by mouth every 8 (eight) hours as needed for moderate pain or severe pain. 05/05/15  Yes Jolene Provost, MD   Meds Ordered and Administered this Visit  Medications - No data to display  BP 133/75 mmHg  Pulse 99  Temp(Src) 97.5 F (36.4 C) (Oral)  Resp 18  SpO2 97% No data found.   Physical Exam   GENERAL: NAD SKIN: Gluteal Cleft- quarter sized area of induration, still draining, no streaks, mild tenderness   ED Course  Procedures (including critical care time)  Labs Review Labs Reviewed - No data to display  Imaging Review No results found.   MDM  A/P: Pilonidal cyst/abscess-discussed with patient that he should've followed up for repacking as instructed, at this point would refill the Bactrim DS that he was on and have him follow-up with Gen. Surgery as soon as possible, the area is much improved and when he initially was seen here for incision and drainage. Keep area clean and dry as discussed. If  any acute problems he is to seek medical attention promptly.    Jolene Provost, MD 05/20/15 [phone]

## 2015-05-20 NOTE — ED Notes (Signed)
Patient was seen on 05/05/15 for a pilonidal cyst. Cyst was excise and pack. Per patient did not return for his follow up appointment. Pt. Mom stated removed packing x 3 days after. Per mom cyst not healing / still drainage and would like to know if they can have refill of antibiotic.

## 2015-08-03 ENCOUNTER — Telehealth: Payer: Self-pay | Admitting: Nurse Practitioner

## 2015-08-03 ENCOUNTER — Ambulatory Visit (INDEPENDENT_AMBULATORY_CARE_PROVIDER_SITE_OTHER): Payer: 59 | Admitting: Nurse Practitioner

## 2015-08-03 DIAGNOSIS — L0591 Pilonidal cyst without abscess: Secondary | ICD-10-CM

## 2015-08-03 NOTE — Telephone Encounter (Signed)
Please advise, Thanks!  

## 2015-08-03 NOTE — Telephone Encounter (Signed)
Please charge no show fee- had time to schedule transportation

## 2015-08-03 NOTE — Telephone Encounter (Signed)
Erie NoeVanessa please advise! Thanks

## 2015-08-03 NOTE — Telephone Encounter (Signed)
FYI, Pt mom called stating pt will not be able to make appt today. Due to not having a ride. Appt is still on the sch. Thank you!

## 2015-08-03 NOTE — Progress Notes (Signed)
Pre visit review using our clinic review tool, if applicable. No additional management support is needed unless otherwise documented below in the visit note. 

## 2015-08-12 ENCOUNTER — Encounter: Payer: Self-pay | Admitting: Nurse Practitioner

## 2015-08-12 NOTE — Assessment & Plan Note (Signed)
No show Samuel Miranda opened encounter erroneously

## 2015-08-21 ENCOUNTER — Ambulatory Visit (INDEPENDENT_AMBULATORY_CARE_PROVIDER_SITE_OTHER): Payer: 59 | Admitting: General Surgery

## 2015-08-21 ENCOUNTER — Encounter: Payer: Self-pay | Admitting: General Surgery

## 2015-08-21 VITALS — BP 154/88 | HR 94 | Temp 94.0°F | Ht 65.0 in | Wt 235.0 lb

## 2015-08-21 DIAGNOSIS — L0591 Pilonidal cyst without abscess: Secondary | ICD-10-CM

## 2015-08-21 MED ORDER — OXYCODONE-ACETAMINOPHEN 5-325 MG PO TABS: 1.0000 | ORAL_TABLET | Freq: Three times a day (TID) | ORAL | Status: DC | PRN

## 2015-08-21 NOTE — Patient Instructions (Signed)
We have discussed your surgery for the Piolonidal Cyst on Sep 06, 2015. Please call our office if you have any questions or concerns. Blue sheet was discussed with patient .

## 2015-08-21 NOTE — Progress Notes (Signed)
Outpatient Surgical Follow Up  08/21/2015  Samuel Miranda is an 19 y.o. male.   Chief Complaint  Patient presents with  . Follow-up    Seen in ED Pilonidal Cyst    HPI: 19-year-old male returns to clinic for evaluation of recurrent pilonidal cyst infections. Patient was evaluated for this last fall by Dr. Bird but was lost to follow-up and did not have the surgery. He states that he had another infection to the area last month which required additional course of antibiotic. It is felt better since that he is tired of dealing with that he states he's had numerous infections requiring numerous courses of antibiotics. He is attempted to keep the area free of hair this only caused the hair to grow back thicker. He denies any current fevers, chills, nausea, vomiting, diarrhea, constipation, chest pain, shortness of breath. His only complaints are of pressure from the area of the cyst and he has not noted any active drainage. He is here today to discuss surgical removal.  Past Medical History  Diagnosis Date  . Pilonidal cyst 2016  . Hay fever   . GERD (gastroesophageal reflux disease)     As a child  . History of learning disability     From childhood events- low oxygenation for short periods of time    Past Surgical History  Procedure Laterality Date  . 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 Cystectomy  2014    cyst Removed from bladder    Family History  Problem Relation Age of Onset  . Asthma Mother   . Diabetes Mother   . Hyperlipidemia Mother   . Hypertension Mother   . Alcohol abuse Father   . Alcohol abuse Maternal Grandmother   . COPD Maternal Grandmother   . Cancer Maternal Aunt 52    Breast Cancer  . Diabetes Maternal Uncle   . COPD Maternal Grandfather   . Hypertension Maternal Grandfather   . Diabetes Paternal Grandmother   . Stroke Paternal Grandmother   . Heart disease Paternal Grandfather   . COPD Paternal Grandfather     Social History:  reports that  he has never smoked. He has never used smokeless tobacco. He reports that he does not drink alcohol or use illicit drugs.  Allergies:  Allergies  Allergen Reactions  . Penicillins Shortness Of Breath and Rash  . Augmentin [Amoxicillin-Pot Clavulanate] Nausea And Vomiting  . Keflex [Cephalexin] Nausea And Vomiting    Medications reviewed.    ROS A multipoint review of systems was completed, all pertinent positives and negatives are documented within the history of present illness and remainder are negative   BP 154/88 mmHg  Pulse 94  Temp(Src) 94 F (34.4 C)  Ht 5' 5" (1.651 m)  Wt 106.595 kg (235 lb)  BMI 39.11 kg/m2  Physical Exam Gen.: No acute distress Neck supple and nontender Lymph nodes: No evidence of axillary, clavicular, cervical lymphadenopathy Chest: Clear to auscultation Heart: Regular rate and rhythm Abdomen: Soft and nontender Rectal: Very hirsute, 2 separate easily identifiable pits with a palpable cyst between along the tailbone. No evidence of active infection or drainage.    No results found for this or any previous visit (from the past 48 hour(s)). No results found.  Assessment/Plan:  1. Pilonidal cyst without infection 19-year-old male with a recurrent pilonidal cyst. Discussed the surgery of a pilonidal cystectomy in detail with the patient and his mother to include the risks, benefits, alternatives. Discussed specifically   the risks of pain, bleeding, infection, need for prolonged wound care and prolonged healing course. They voice understanding and wished to proceed. We will plan to proceed to the operating room on Thursday, May 25 for a pilonidal cystectomy.     Ricarda Frameharles Cayla Wiegand, MD FACS General Surgeon  08/21/2015,10:56 AM

## 2015-08-22 ENCOUNTER — Telehealth: Payer: Self-pay | Admitting: General Surgery

## 2015-08-22 NOTE — Telephone Encounter (Signed)
Pt advised of pre op date/time and sx date. Sx: 09/06/15 with Dr Tommie ArdWoodham--Pilonidal cystectomy.  Pre op: 08/29/15 between 9-1:00pm.--Phone.   Patient made aware to call [phone], between 1-3:00pm the day before surgery, to find out what time to arrive.

## 2015-08-29 ENCOUNTER — Encounter: Payer: Self-pay | Admitting: *Deleted

## 2015-08-29 ENCOUNTER — Other Ambulatory Visit: Payer: Self-pay

## 2015-08-29 NOTE — Patient Instructions (Signed)
  Your procedure is scheduled on: 09-06-15 Report to [facility] 2ND FLOOR To find out your arrival time please call [phone] between 1PM - 3PM on 09-05-15  Remember: Instructions that are not followed completely may result in serious medical risk, up to and including death, or upon the discretion of your surgeon and anesthesiologist your surgery may need to be rescheduled.    _X___ 1. Do not eat food or drink liquids after midnight. No gum chewing or hard candies.     _X___ 2. No Alcohol for 24 hours before or after surgery.   ____ 3. Bring all medications with you on the day of surgery if instructed.    ____ 4. Notify your doctor if there is any change in your medical condition     (cold, fever, infections).     Do not wear jewelry, make-up, hairpins, clips or nail polish.  Do not wear lotions, powders, or perfumes. You may wear deodorant.  Do not shave 48 hours prior to surgery. Men may shave face and neck.  Do not bring valuables to the hospital.    [facility] is not responsible for any belongings or valuables.               Contacts, dentures or bridgework may not be worn into surgery.  Leave your suitcase in the car. After surgery it may be brought to your room.  For patients admitted to the hospital, discharge time is determined by your treatment team.   Patients discharged the day of surgery will not be allowed to drive home.   Please read over the following fact sheets that you were given:      ____ Take these medicines the morning of surgery with A SIP OF WATER:    1. NONE  2.   3.   4.  5.  6.  ____ Fleet Enema (as directed)   ____ Use CHG Soap as directed  ____ Use inhalers on the day of surgery  ____ Stop metformin 2 days prior to surgery    ____ Take 1/2 of usual insulin dose the night before surgery and none on the morning of surgery.   ____ Stop Coumadin/Plavix/aspirin-N/A  _X___ Stop Anti-inflammatories-STOP IBUPROFEN NOW-NO  NSAIDS OR ASA PRODUCTS-TYLENOL OK TO CONTINUE   ____ Stop supplements until after surgery.    ____ Bring C-Pap to the hospital.

## 2015-08-30 ENCOUNTER — Encounter: Payer: Self-pay | Admitting: Nurse Practitioner

## 2015-09-03 ENCOUNTER — Encounter: Payer: Self-pay | Admitting: Family Medicine

## 2015-09-03 ENCOUNTER — Ambulatory Visit (INDEPENDENT_AMBULATORY_CARE_PROVIDER_SITE_OTHER): Payer: 59 | Admitting: Family Medicine

## 2015-09-03 ENCOUNTER — Encounter: Payer: Self-pay | Admitting: Nurse Practitioner

## 2015-09-03 VITALS — BP 124/76 | HR 100 | Temp 98.5°F | Ht 65.0 in | Wt 236.6 lb

## 2015-09-03 DIAGNOSIS — Z0001 Encounter for general adult medical examination with abnormal findings: Secondary | ICD-10-CM | POA: Diagnosis not present

## 2015-09-03 DIAGNOSIS — E669 Obesity, unspecified: Secondary | ICD-10-CM

## 2015-09-03 DIAGNOSIS — M7989 Other specified soft tissue disorders: Secondary | ICD-10-CM | POA: Insufficient documentation

## 2015-09-03 DIAGNOSIS — R109 Unspecified abdominal pain: Secondary | ICD-10-CM

## 2015-09-03 DIAGNOSIS — R059 Cough, unspecified: Secondary | ICD-10-CM | POA: Insufficient documentation

## 2015-09-03 DIAGNOSIS — R05 Cough: Secondary | ICD-10-CM | POA: Insufficient documentation

## 2015-09-03 NOTE — Assessment & Plan Note (Addendum)
Patient is morbidly obese. Discussed diet and exercise and given dietary instructions. He'll cut back on soft drink intake. Discussed exercise. He is up-to-date on tetanus vaccine. HIV testing has been done previously. Lab work as outlined below. See back in a month.

## 2015-09-03 NOTE — Assessment & Plan Note (Signed)
Reports swelling of extremities. No symptoms of CHF. We'll check lab work as outlined below to workup. Suspect likely related to salt intake.

## 2015-09-03 NOTE — Assessment & Plan Note (Signed)
Patient with reported cough with exertion concerning for cough variant asthma. Sometimes responsive to albuterol. Benign lung exam. Stable vital signs. We will order PFTs to evaluate this further.

## 2015-09-03 NOTE — Assessment & Plan Note (Signed)
Intermittent issue associated with some diarrhea. Does report a family history of IBS D. Benign abdominal exam. No other symptoms with this. Potentially could be IBS related. He'll continue to monitor. Given return precautions.

## 2015-09-03 NOTE — Patient Instructions (Signed)
Nice to meet you. You need to start exercise by walking 2-3 days a week for 20-30 minutes at a time. You should also begin to work on M.[employer]. Holdingsyour diet. There are some suggestions listed below. We will obtain lung function test to evaluate for possible asthma. Please follow up with your eye doctor.  Diet Recommendations  Starchy (carb) foods: Bread, rice, pasta, potatoes, corn, cereal, grits, crackers, bagels, muffins, all baked goods.  (Fruits, milk, and yogurt also have carbohydrate, but most of these foods will not spike your blood sugar as the starchy foods will.)  A few fruits do cause high blood sugars; use small portions of bananas (limit to 1/2 at a time), grapes, watermelon, oranges, and most tropical fruits.    Protein foods: Meat, fish, poultry, eggs, dairy foods, and beans such as pinto and kidney beans (beans also provide carbohydrate).   1. Eat at least 3 meals and 1-2 snacks per day. Never go more than 4-5 hours while awake without eating. Eat breakfast within the first hour of getting up.   2. Limit starchy foods to TWO per meal and ONE per snack. ONE portion of a starchy  food is equal to the following:   - ONE slice of bread (or its equivalent, such as half of a hamburger bun).   - 1/2 cup of a "scoopable" starchy food such as potatoes or rice.   - 15 grams of carbohydrate as shown on food label.  3. Include at every meal: a protein food, a carb food, and vegetables and/or fruit.   - Obtain twice the volume of veg's as protein or carbohydrate foods for both lunch and dinner.   - Fresh or frozen veg's are best.   - Keep frozen veg's on hand for a quick vegetable serving.

## 2015-09-03 NOTE — Progress Notes (Signed)
Pre visit review using our clinic review tool, if applicable. No additional management support is needed unless otherwise documented below in the visit note. 

## 2015-09-03 NOTE — Progress Notes (Signed)
Patient ID: Samuel Miranda, male   DOB: 01/03/1996, 20 y.o.   MRN: [id number]  Tommi Rumps, MD Phone: [phone]  Samuel Miranda is a 20 y.o. male who presents today for physical exam.  Concerns today include cough. Patient and his mother both note that he gets a cough when physically exerting himself and occasionally does get mildly winded. He has no wheezing. No chest pain. No history of asthma. Does use an inhaler which is sometimes beneficial. 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  Does note occasional swelling of his hands and feet when he drinks too much soda. Notes this is improved by drinking water. No orthopnea or PND.  Does note occasional abdominal cramping associated with what food he eats and depending on how much he coughs. Also with occasional diarrhea that is determined by what kind of food he is eating. No blood in his stool. No nausea or vomiting. No constipation.  Diet is poor and eats whatever he wants. Drinks at least 3 sodas a day. No exercise. Last tetanus shot was at age 59. Prior HIV screening done. No alcohol, illicit drug use, or tobacco use. He is not sexually active.  Active Ambulatory Problems    Diagnosis Date Noted  . Encounter to establish care 06/21/2014  . Pilonidal cyst without infection 06/21/2014  . Encounter for general adult medical examination with abnormal findings 09/03/2015  . Cough 09/03/2015  . Swelling of extremities 09/03/2015  . Abdominal cramping 09/03/2015   Resolved Ambulatory Problems    Diagnosis Date Noted  . No Resolved Ambulatory Problems   Past Medical History  Diagnosis Date  . Pilonidal cyst 2016  . Hay fever   . GERD (gastroesophageal reflux disease)   . History of learning disability   . Headache     Family History  Problem Relation Age of Onset  . Asthma Mother   . Diabetes Mother   . Hyperlipidemia Mother   . Hypertension Mother   . Alcohol  abuse Father   . Alcohol abuse Maternal Grandmother   . COPD Maternal Grandmother   . Cancer Maternal Aunt 54    Breast Cancer  . Diabetes Maternal Uncle   . COPD Maternal Grandfather   . Hypertension Maternal Grandfather   . Diabetes Paternal Grandmother   . Stroke Paternal Grandmother   . Heart disease Paternal Grandfather   . COPD Paternal Grandfather     Social History   Social History  . Marital Status: Single    Spouse Name: N/A  . Number of Children: N/A  . Years of Education: N/A   Occupational History  . Not on file.   Social History Main Topics  . Smoking status: Never Smoker   . Smokeless tobacco: Never Used  . Alcohol Use: No  . Drug Use: No  . Sexual Activity: No   Other Topics Concern  . 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 Mildly), Negative for trouble hearing, trouble seeing, mouth sores, hoarseness, change in  voice, dysphagia. CV:  Positive for edema, Negative for chest pain, dyspnea, edema, palpitations Resp: Positive for cough, dyspnea, negative for hemoptysis GI: Positive for diarrhea, abdominal cramping, Negative for nausea, vomiting, constipation, melena, hematochezia. GU: Negative for dysuria, incontinence, urinary hesitance, hematuria, vaginal or penile discharge, polyuria, sexual difficulty, lumps in testicle or breasts MSK: Negative for muscle cramps or aches, joint pain or swelling Neuro: Negative for headaches, weakness, numbness, dizziness, passing out/fainting Psych: Negative for depression, anxiety, memory problems  Objective  Physical Exam Filed Vitals:   09/03/15 1331  BP:  124/76  Pulse: 100  Temp: 98.5 F (36.9 C)    BP Readings from Last 3 Encounters:  09/03/15 124/76  08/21/15 154/88  05/20/15 133/75   Wt Readings from Last 3 Encounters:  09/03/15 236 lb 9.6 oz (107.321 kg)  08/21/15 235 lb (106.595 kg)  05/05/15 236 lb (107.049 kg)    Physical Exam  Constitutional: He is well-developed, well-nourished, and in no distress.  HENT:  Head: Normocephalic and atraumatic.  Right Ear: External ear normal.  Left Ear: External ear normal.  Mouth/Throat: Oropharynx is clear and moist. No oropharyngeal exudate.  Eyes: Conjunctivae are normal. Pupils are equal, round, and reactive to light.  Neck: Neck supple.  Cardiovascular: Normal rate, regular rhythm and normal heart sounds.   Pulmonary/Chest: Effort normal and breath sounds normal.  Abdominal: Soft. Bowel sounds are normal. He exhibits no distension. There is no tenderness. There is no rebound and no guarding.  Musculoskeletal: He exhibits no edema.  Lymphadenopathy:    He has no cervical adenopathy.  Neurological: He is alert. Gait normal.  Skin: Skin is warm and dry. He is not diaphoretic.  Psychiatric: Mood and affect normal.     Assessment/Plan:   Encounter for general adult medical examination with abnormal findings Patient is morbidly obese. Discussed diet and exercise and given dietary instructions. He'll cut back on soft drink intake. Discussed exercise. He is up-to-date on tetanus vaccine. HIV testing has been done previously. Lab work as outlined below. See back in a month.  Cough Patient with reported cough with exertion concerning for cough variant asthma. Sometimes responsive to albuterol. Benign lung exam. Stable vital signs. We will order PFTs to evaluate this further.  Swelling of extremities Reports swelling of extremities. No symptoms of CHF. We'll check lab work as outlined below to workup. Suspect likely related to salt intake.  Abdominal cramping Intermittent issue  associated with some diarrhea. Does report a family history of IBS D. Benign abdominal exam. No other symptoms with this. Potentially could be IBS related. He'll continue to monitor. Given return precautions.    Orders Placed This Encounter  Procedures  . HgB A1c    Standing Status: Future     Number of Occurrences:      Standing Expiration Date: 09/02/2016  . Comp Met (CMET)    Standing Status: Future     Number of Occurrences:      Standing Expiration Date: 09/02/2016  . Lipid Profile    Standing Status: Future     Number of Occurrences:      Standing Expiration Date: 09/02/2016  . CBC    Standing Status: Future     Number of Occurrences:      Standing Expiration Date: 09/02/2016  . TSH    Standing Status: Future     Number of Occurrences:      Standing Expiration Date: 09/02/2016  . Pulmonary Function Test    Standing Status: Future  Number of Occurrences:      Standing Expiration Date: 09/02/2016    Scheduling Instructions:     With pulmonologist interpretation.    Order Specific Question:  Where should this test be performed?    Answer:  [facility]    Order Specific Question:  Full PFT: includes the following: basic spirometry, spirometry pre & post bronchodilator, diffusion capacity (DLCO), lung volumes    Answer:  Full PFT    No orders of the defined types were placed in this encounter.     Tommi Rumps, MD [place]

## 2015-09-06 ENCOUNTER — Ambulatory Visit: Payer: 59 | Admitting: Anesthesiology

## 2015-09-06 ENCOUNTER — Encounter
Admission: RE | Disposition: A | Discharge status: Home / Self Care | Payer: Self-pay | Source: Ambulatory Visit | Attending: General Surgery

## 2015-09-06 ENCOUNTER — Ambulatory Visit
Admission: RE | Admit: 2015-09-06 | Discharge: 2015-09-06 | Disposition: A | Discharge status: Home / Self Care | Payer: 59 | Source: Ambulatory Visit | Attending: General Surgery | Admitting: General Surgery

## 2015-09-06 DIAGNOSIS — Z811 Family history of alcohol abuse and dependence: Secondary | ICD-10-CM | POA: Diagnosis not present

## 2015-09-06 DIAGNOSIS — R51 Headache: Secondary | ICD-10-CM | POA: Insufficient documentation

## 2015-09-06 DIAGNOSIS — Z881 Allergy status to other antibiotic agents status: Secondary | ICD-10-CM | POA: Diagnosis not present

## 2015-09-06 DIAGNOSIS — Z825 Family history of asthma and other chronic lower respiratory diseases: Secondary | ICD-10-CM | POA: Insufficient documentation

## 2015-09-06 DIAGNOSIS — J301 Allergic rhinitis due to pollen: Secondary | ICD-10-CM | POA: Insufficient documentation

## 2015-09-06 DIAGNOSIS — Z823 Family history of stroke: Secondary | ICD-10-CM | POA: Insufficient documentation

## 2015-09-06 DIAGNOSIS — L0501 Pilonidal cyst with abscess: Secondary | ICD-10-CM | POA: Diagnosis not present

## 2015-09-06 DIAGNOSIS — Z833 Family history of diabetes mellitus: Secondary | ICD-10-CM | POA: Diagnosis not present

## 2015-09-06 DIAGNOSIS — Z8249 Family history of ischemic heart disease and other diseases of the circulatory system: Secondary | ICD-10-CM | POA: Diagnosis not present

## 2015-09-06 DIAGNOSIS — Z803 Family history of malignant neoplasm of breast: Secondary | ICD-10-CM | POA: Diagnosis not present

## 2015-09-06 DIAGNOSIS — Z880 Allergy status to penicillin: Secondary | ICD-10-CM | POA: Diagnosis not present

## 2015-09-06 DIAGNOSIS — L0591 Pilonidal cyst without abscess: Secondary | ICD-10-CM | POA: Insufficient documentation

## 2015-09-06 HISTORY — DX: Headache: R51

## 2015-09-06 HISTORY — PX: PILONIDAL CYST EXCISION: SHX744

## 2015-09-06 HISTORY — DX: Headache, unspecified: R51.9

## 2015-09-06 SURGERY — EXCISION, PILONIDAL CYST, EXTENSIVE
Anesthesia: General | Wound class: Dirty or Infected

## 2015-09-06 MED ORDER — HYDROMORPHONE HCL 1 MG/ML IJ SOLN: 0.2500 mg | INTRAMUSCULAR | Status: DC | PRN

## 2015-09-06 MED ORDER — SODIUM BICARBONATE 4 % IV SOLN
INTRAVENOUS | Status: DC | PRN
  Administered 2015-09-06: 25 mL via INTRAMUSCULAR

## 2015-09-06 MED ORDER — ONDANSETRON HCL 4 MG/2ML IJ SOLN
INTRAMUSCULAR | Status: DC | PRN
  Administered 2015-09-06: 4 mg via INTRAVENOUS

## 2015-09-06 MED ORDER — PROPOFOL 10 MG/ML IV BOLUS
INTRAVENOUS | Status: DC | PRN
  Administered 2015-09-06: 200 mg via INTRAVENOUS

## 2015-09-06 MED ORDER — FAMOTIDINE 20 MG PO TABS
ORAL_TABLET | ORAL | Status: AC
  Administered 2015-09-06: 20 mg via ORAL
  Filled 2015-09-06: qty 1

## 2015-09-06 MED ORDER — OXYCODONE-ACETAMINOPHEN 5-325 MG PO TABS: 1.0000 | ORAL_TABLET | ORAL | Status: DC | PRN

## 2015-09-06 MED ORDER — FENTANYL CITRATE (PF) 100 MCG/2ML IJ SOLN
INTRAMUSCULAR | Status: DC | PRN
  Administered 2015-09-06: 50 ug via INTRAVENOUS
  Administered 2015-09-06: 100 ug via INTRAVENOUS

## 2015-09-06 MED ORDER — CLINDAMYCIN PHOSPHATE 600 MG/50ML IV SOLN: 600.0000 mg | INTRAVENOUS | Status: DC

## 2015-09-06 MED ORDER — BUPIVACAINE HCL (PF) 0.5 % IJ SOLN
INTRAMUSCULAR | Status: AC
  Filled 2015-09-06: qty 30

## 2015-09-06 MED ORDER — BACITRACIN 500 UNIT/GM EX OINT
TOPICAL_OINTMENT | CUTANEOUS | Status: DC | PRN
  Administered 2015-09-06: 1 via TOPICAL

## 2015-09-06 MED ORDER — SUCCINYLCHOLINE CHLORIDE 20 MG/ML IJ SOLN
INTRAMUSCULAR | Status: DC | PRN
  Administered 2015-09-06: 120 mg via INTRAVENOUS

## 2015-09-06 MED ORDER — CLINDAMYCIN PHOSPHATE 600 MG/50ML IV SOLN
INTRAVENOUS | Status: AC
  Filled 2015-09-06: qty 50

## 2015-09-06 MED ORDER — FAMOTIDINE 20 MG PO TABS
20.0000 mg | ORAL_TABLET | Freq: Once | ORAL | Status: AC
  Administered 2015-09-06: 20 mg via ORAL

## 2015-09-06 MED ORDER — CHLORHEXIDINE GLUCONATE 4 % EX LIQD
1.0000 "application " | Freq: Once | CUTANEOUS | Status: AC
  Administered 2015-09-06: 1 via TOPICAL

## 2015-09-06 MED ORDER — BACITRACIN ZINC 500 UNIT/GM EX OINT
TOPICAL_OINTMENT | CUTANEOUS | Status: AC
  Filled 2015-09-06: qty 0.9

## 2015-09-06 MED ORDER — MIDAZOLAM HCL 2 MG/2ML IJ SOLN
INTRAMUSCULAR | Status: DC | PRN
  Administered 2015-09-06: 2 mg via INTRAVENOUS

## 2015-09-06 MED ORDER — LIDOCAINE HCL (PF) 1 % IJ SOLN
INTRAMUSCULAR | Status: AC
  Filled 2015-09-06: qty 30

## 2015-09-06 MED ORDER — LIDOCAINE HCL (CARDIAC) 20 MG/ML IV SOLN
INTRAVENOUS | Status: DC | PRN
  Administered 2015-09-06: 100 mg via INTRAVENOUS

## 2015-09-06 MED ORDER — DEXMEDETOMIDINE HCL 200 MCG/2ML IV SOLN
INTRAVENOUS | Status: DC | PRN
  Administered 2015-09-06: 8 ug via INTRAVENOUS

## 2015-09-06 MED ORDER — DEXAMETHASONE SODIUM PHOSPHATE 4 MG/ML IJ SOLN
INTRAMUSCULAR | Status: DC | PRN
  Administered 2015-09-06: 10 mg via INTRAVENOUS

## 2015-09-06 MED ORDER — ROCURONIUM BROMIDE 100 MG/10ML IV SOLN
INTRAVENOUS | Status: DC | PRN
  Administered 2015-09-06: 20 mg via INTRAVENOUS

## 2015-09-06 MED ORDER — LACTATED RINGERS IV SOLN
INTRAVENOUS | Status: DC
  Administered 2015-09-06 (×2): via INTRAVENOUS

## 2015-09-06 MED ORDER — SUGAMMADEX SODIUM 200 MG/2ML IV SOLN
INTRAVENOUS | Status: DC | PRN
  Administered 2015-09-06: 214 mg via INTRAVENOUS

## 2015-09-06 MED ORDER — CHLORHEXIDINE GLUCONATE 4 % EX LIQD
1.0000 "application " | Freq: Once | CUTANEOUS | Status: AC
  Administered 2015-09-05: 1 via TOPICAL

## 2015-09-06 SURGICAL SUPPLY — 25 items
BLADE SURG 15 STRL LF DISP TIS (BLADE) ×1 IMPLANT
BLADE SURG 15 STRL SS (BLADE) ×2
BRIEF STRETCH MATERNITY 2XLG (MISCELLANEOUS) ×3 IMPLANT
CANISTER SUCT 1200ML W/VALVE (MISCELLANEOUS) ×3 IMPLANT
CHLORAPREP W/TINT 26ML (MISCELLANEOUS) ×3 IMPLANT
DRAPE LAPAROTOMY 100X77 ABD (DRAPES) ×3 IMPLANT
ELECT CAUTERY BLADE 6.4 (BLADE) ×3 IMPLANT
ELECT REM PT RETURN 9FT ADLT (ELECTROSURGICAL) ×3
ELECTRODE REM PT RTRN 9FT ADLT (ELECTROSURGICAL) ×1 IMPLANT
GLOVE BIO SURGEON STRL SZ7.5 (GLOVE) ×6 IMPLANT
GLOVE EXAM NITRILE PF MED BLUE (GLOVE) ×3 IMPLANT
GLOVE INDICATOR 8.0 STRL GRN (GLOVE) ×3 IMPLANT
GOWN STRL REUS W/ TWL LRG LVL3 (GOWN DISPOSABLE) ×2 IMPLANT
GOWN STRL REUS W/TWL LRG LVL3 (GOWN DISPOSABLE) ×4
KIT RM TURNOVER STRD PROC AR (KITS) ×3 IMPLANT
LABEL OR SOLS (LABEL) ×3 IMPLANT
NEEDLE HYPO 25X1 1.5 SAFETY (NEEDLE) ×3 IMPLANT
NS IRRIG 500ML POUR BTL (IV SOLUTION) ×3 IMPLANT
PACK BASIN MINOR ARMC (MISCELLANEOUS) ×3 IMPLANT
PAD ABD DERMACEA PRESS 5X9 (GAUZE/BANDAGES/DRESSINGS) ×3 IMPLANT
SUT ETHILON 3-0 FS-10 30 BLK (SUTURE) ×3
SUT VICRYL+ 3-0 36IN CT-1 (SUTURE) ×3 IMPLANT
SUTURE EHLN 3-0 FS-10 30 BLK (SUTURE) ×1 IMPLANT
SYR BULB IRRIG 60ML STRL (SYRINGE) ×3 IMPLANT
SYRINGE 10CC LL (SYRINGE) ×3 IMPLANT

## 2015-09-06 NOTE — Anesthesia Preprocedure Evaluation (Signed)
Anesthesia Evaluation  Patient identified by MRN, date of birth, ID band Patient awake    Reviewed: Allergy & Precautions, H&P , NPO status , Patient's Chart, lab work & pertinent test results  History of Anesthesia Complications Negative for: history of anesthetic complications  Airway Mallampati: III  TM Distance: >3 FB Neck ROM: full    Dental  (+) Poor Dentition   Pulmonary neg pulmonary ROS, neg shortness of breath,    Pulmonary exam normal breath sounds clear to auscultation       Cardiovascular Exercise Tolerance: Good (-) angina(-) Past MI and (-) DOE negative cardio ROS Normal cardiovascular exam Rhythm:regular Rate:Normal     Neuro/Psych  Headaches, negative psych ROS   GI/Hepatic Neg liver ROS, GERD  Controlled,  Endo/Other  negative endocrine ROS  Renal/GU negative Renal ROS  negative genitourinary   Musculoskeletal   Abdominal   Peds  Hematology negative hematology ROS (+)   Anesthesia Other Findings Past Medical History:   Pilonidal cyst                                  2016         Hay fever                                                    GERD (gastroesophageal reflux disease)                         Comment:As a child   History of learning disability                                 Comment:From childhood events- low oxygenation for               short periods of time   Headache                                                    Past Surgical History:   CYSTECTOMY                                       2012           Comment:Bladder tumor removed   CYSTECTOMY                                       2014           Comment:cyst Removed from bladder   TRACHEOSTOMY                                                    Comment:AGE 20 DUE ACID REFLUX PER PT  BMI    Body Mass Index   39.27 kg/m  2    Signs and symptoms suggestive of sleep apnea    Reproductive/Obstetrics negative OB ROS                              Anesthesia Physical Anesthesia Plan  ASA: III  Anesthesia Plan: General ETT   Post-op Pain Management:    Induction:   Airway Management Planned:   Additional Equipment:   Intra-op Plan:   Post-operative Plan:   Informed Consent: I have reviewed the patients History and Physical, chart, labs and discussed the procedure including the risks, benefits and alternatives for the proposed anesthesia with the patient or authorized representative who has indicated his/her understanding and acceptance.   Dental Advisory Given  Plan Discussed with: Anesthesiologist, CRNA and Surgeon  Anesthesia Plan Comments:         Anesthesia Quick Evaluation

## 2015-09-06 NOTE — Transfer of Care (Signed)
Immediate Anesthesia Transfer of Care Note  Patient: Samuel Miranda  Procedure(s) Performed: Procedure(s): CYST EXCISION PILONIDAL EXTENSIVE (N/A)  Patient Location: PACU  Anesthesia Type:General  Level of Consciousness: awake and sedated  Airway & Oxygen Therapy: Patient Spontanous Breathing and Patient connected to face mask oxygen  Post-op Assessment: Report given to RN and Post -op Vital signs reviewed and stable  Post vital signs: Reviewed and stable  Last Vitals:  Filed Vitals:   09/06/15 0613  BP: 153/99  Pulse: 95  Temp: 36.7 C  Resp: 20    Last Pain: There were no vitals filed for this visit.       Complications: No apparent anesthesia complications

## 2015-09-06 NOTE — H&P (View-Only) (Signed)
Outpatient Surgical Follow Up  08/21/2015  Samuel Miranda is an 20 y.o. male.   Chief Complaint  Patient presents with  . Follow-up    Seen in ED Pilonidal Cyst    HPI: 20 year old male returns to clinic for evaluation of recurrent pilonidal cyst infections. Patient was evaluated for this last fall by Dr. Egbert Garibaldi but was lost to follow-up and did not have the surgery. He states that he had another infection to the area last month which required additional course of antibiotic. It is felt better since that he is tired of dealing with that he states he's had numerous infections requiring numerous courses of antibiotics. He is attempted to keep the area free of hair this only caused the hair to grow back thicker. He denies any current fevers, chills, nausea, vomiting, diarrhea, constipation, chest pain, shortness of breath. His only complaints are of pressure from the area of the cyst and he has not noted any active drainage. He is here today to discuss surgical removal.  Past Medical History  Diagnosis Date  . Pilonidal cyst 2016  . Hay fever   . GERD (gastroesophageal reflux disease)     As a child  . History of learning disability     From childhood events- low oxygenation for short periods of time    Past Surgical History  Procedure Laterality Date  . 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 Cystectomy  2014    cyst Removed from bladder    Family History  Problem Relation Age of Onset  . Asthma Mother   . Diabetes Mother   . Hyperlipidemia Mother   . Hypertension Mother   . Alcohol abuse Father   . Alcohol abuse Maternal Grandmother   . COPD Maternal Grandmother   . Cancer Maternal Aunt 26    Breast Cancer  . Diabetes Maternal Uncle   . COPD Maternal Grandfather   . Hypertension Maternal Grandfather   . Diabetes Paternal Grandmother   . Stroke Paternal Grandmother   . Heart disease Paternal Grandfather   . COPD Paternal Grandfather     Social History:  reports that  he has never smoked. He has never used smokeless tobacco. He reports that he does not drink alcohol or use illicit drugs.  Allergies:  Allergies  Allergen Reactions  . Penicillins Shortness Of Breath and Rash  . Augmentin [Amoxicillin-Pot Clavulanate] Nausea And Vomiting  . Keflex [Cephalexin] Nausea And Vomiting    Medications reviewed.    ROS A multipoint review of systems was completed, all pertinent positives and negatives are documented within the history of present illness and remainder are negative   BP 154/88 mmHg  Pulse 94  Temp(Src) 94 F (34.4 C)  Ht  (1.651 m)  Wt 106.595 kg (235 lb)  BMI 39.11 kg/m2  Physical Exam Gen.: No acute distress Neck supple and nontender Lymph nodes: No evidence of axillary, clavicular, cervical lymphadenopathy Chest: Clear to auscultation Heart: Regular rate and rhythm Abdomen: Soft and nontender Rectal: Very hirsute, 2 separate easily identifiable pits with a palpable cyst between along the tailbone. No evidence of active infection or drainage.    No results found for this or any previous visit (from the past 48 hour(s)). No results found.  Assessment/Plan:  1. 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 Discussed the surgery of a pilonidal cystectomy in detail with the patient and his mother to include the risks, benefits, alternatives. Discussed specifically  the risks of pain, bleeding, infection, need for prolonged wound care and prolonged healing course. They voice understanding and wished to proceed. We will plan to proceed to the operating room on Thursday, May 25 for a pilonidal cystectomy.     Ricarda Frameharles Geeta Dworkin, MD FACS General Surgeon  08/21/2015,10:56 AM

## 2015-09-06 NOTE — Discharge Instructions (Signed)
Excision of a Pilonidal Cyst, Care After Refer to this sheet in the next few weeks. These instructions provide you with information on caring for yourself after your procedure. Your health care provider may also give you more specific instructions. Your treatment has been planned according to current medical practices, but problems sometimes occur. Call your health care provider if you have any problems or questions after your procedure. WHAT TO EXPECT AFTER THE PROCEDURE After your procedure, it is typical to have the following:  Pain near or at the surgical area.  Blood-tinged discharge on your bandage (dressing). HOME CARE INSTRUCTIONS  Take medicines only as directed by your health care provider.  If you were prescribed an antibiotic medicine, finish it all even if you start to feel better.  To prevent constipation:  Drink enough fluid to keep your urine clear or pale yellow.  Include lots of whole grains, fruits, and vegetables in your diet.  Do not do activities that irritate or put pressure on your buttocks for about 2 weeks or as directed by your health care provider. These include bike riding, running, and anything that involves a twisting motion.  Do not sit for long periods of time.  Sleep on your side instead of your back.  Ask your health care provider when you can return to work and resume your usual activities.  Wear loose, cotton underwear.  Keep all follow-up visits as directed by your health care provider. This is important.  You can start taking showers or baths the day after surgery.  Let the water from the shower or bath moisten your dressing before you remove it.  After your shower or bath, pat your buttocks area dry with a soft, clean towel and replace your dressing.  Ask your health care provider:  When you can stop using a dressing.  When you can start taking showers or baths. SEEK MEDICAL CARE IF:  Your incision is bleeding.  You have signs of  infection at your incision or around the incision. Watch for:  Drainage.  Redness.  Swelling.  Pain.  There is a bad smell coming from your incision site.  Your pain medicine is not helping.  You have a fever or chills.  You have muscles aches.  You are dizzy.  You feel generally ill.   This information is not intended to replace advice given to you by your health care provider. Make sure you discuss any questions you have with your health care provider.   Document Released: 05/01/2006 Document Revised: 04/21/2014 Document Reviewed: 08/18/2013 Elsevier Interactive Patient Education 2016 [employer] AMBULATORY SURGERY  DISCHARGE INSTRUCTIONS   1) The drugs that you were given will stay in your system until tomorrow so for the next 24 hours you should not:  A) Drive an automobile B) Make any legal decisions C) Drink any alcoholic beverage   2) You may resume regular meals tomorrow.  Today it is better to start with liquids and gradually work up to solid foods.  You may eat anything you prefer, but it is better to start with liquids, then soup and crackers, and gradually work up to solid foods.   3) Please notify your doctor immediately if you have any unusual bleeding, trouble breathing, redness and pain at the surgery site, drainage, fever, or pain not relieved by medication.    4) Additional Instructions:        Please contact your physician with any problems or Same Day Surgery at [phone], Monday through Friday  6 am to 4 pm, or [place] [facility] number at [phone].

## 2015-09-06 NOTE — Brief Op Note (Signed)
09/06/2015  8:23 AM  PATIENT:  Samuel Miranda  20 y.o. male  PRE-OPERATIVE DIAGNOSIS:  pilonidal cyst  POST-OPERATIVE DIAGNOSIS:  pilonidal cyst  PROCEDURE:  Procedure(s): CYST EXCISION PILONIDAL EXTENSIVE (N/A)  SURGEON:  Surgeon(s) and Role:    * Ricarda Frameharles Starla Deller, MD - Primary  PHYSICIAN ASSISTANT:   ASSISTANTS: none   ANESTHESIA:   general  EBL:  Total I/O In: 800 [I.V.:800] Out: 10 [Blood:10]  BLOOD ADMINISTERED:none  DRAINS: none   LOCAL MEDICATIONS USED:  MARCAINE   , XYLOCAINE  and Amount: 25 ml  SPECIMEN:  Source of Specimen:  pilonidal cyst  DISPOSITION OF SPECIMEN:  PATHOLOGY  COUNTS:  YES  TOURNIQUET:  * No tourniquets in log *  DICTATION: .Dragon Dictation  PLAN OF CARE: Discharge to home after PACU  PATIENT DISPOSITION:  PACU - hemodynamically stable.   Delay start of Pharmacological VTE agent (>24hrs) due to surgical blood loss or risk of bleeding: not applicable

## 2015-09-06 NOTE — Anesthesia Procedure Notes (Signed)
Procedure Name: Intubation Date/Time: 09/06/2015 7:34 AM Performed by: Paulette BlanchPARAS, Calah Gershman Pre-anesthesia Checklist: Patient identified, Patient being monitored, Timeout performed, Emergency Drugs available and Suction available Patient Re-evaluated:Patient Re-evaluated prior to inductionOxygen Delivery Method: Circle system utilized Preoxygenation: Pre-oxygenation with 100% oxygen Intubation Type: IV induction Ventilation: Mask ventilation without difficulty Laryngoscope Size: 3 and Miller Grade View: Grade I Tube type: Oral Tube size: 7.5 mm Number of attempts: 1 Airway Equipment and Method: Stylet Placement Confirmation: ETT inserted through vocal cords under direct vision,  positive ETCO2 and breath sounds checked- equal and bilateral Secured at: 21 cm Tube secured with: Tape Dental Injury: Teeth and Oropharynx as per pre-operative assessment

## 2015-09-06 NOTE — Anesthesia Postprocedure Evaluation (Signed)
Anesthesia Post Note  Patient: Samuel Miranda  Procedure(s) Performed: Procedure(s) (LRB): CYST EXCISION PILONIDAL EXTENSIVE (N/A)  Patient location during evaluation: PACU Anesthesia Type: General Level of consciousness: awake and alert Pain management: pain level controlled Vital Signs Assessment: post-procedure vital signs reviewed and stable Respiratory status: spontaneous breathing, nonlabored ventilation, respiratory function stable and patient connected to nasal cannula oxygen Cardiovascular status: blood pressure returned to baseline and stable Postop Assessment: no signs of nausea or vomiting Anesthetic complications: no    Last Vitals:  Filed Vitals:   09/06/15 0915 09/06/15 1000  BP: 131/74 110/68  Pulse: 104 102  Temp: 36.4 C   Resp: 16     Last Pain:  Filed Vitals:   09/06/15 1007  PainSc: Asleep                 Cleda MccreedyJoseph K Desmund Elman

## 2015-09-06 NOTE — Op Note (Signed)
   Pre-operative Diagnosis: Pilonidal cyst  Post-operative Diagnosis: Same  Surgeon: Samuel Miranda   Assistants: None  Anesthesia: General endotracheal anesthesia  ASA Class: 2  Surgeon: Samuel Frameharles Shonica Weier, MD FACS  Anesthesia: Gen. with endotracheal tube  Assistant: None  Procedure Details  The patient was seen again in the Holding Room. The benefits, complications, treatment options, and expected outcomes were discussed with the patient. The risks of bleeding, infection, recurrence of symptoms, failure to resolve symptoms,  bowel injury, any of which could require further surgery were reviewed with the patient.   The patient was taken to Operating Room, identified as Samuel Miranda and the procedure verified.  A Time Out was held and the above information confirmed.  Prior to the induction of general anesthesia, antibiotic prophylaxis was administered. VTE prophylaxis was in place. General endotracheal anesthesia was then administered and tolerated well. After the induction, the lower back was prepped with Chloraprep and draped in the sterile fashion. The patient was positioned in the prone position.  The easily identified by analysis was localized the 50-50 mixture 1% lidocaine 0.5% Marcaine plain. An elliptical incision was made around this and using Bovie much cautery was taken down to the level of healthy fascia below the cyst. This was certainly partially taken down and excised in toto. Meticulous hemostasis was obtained with direct electrocautery. The area was closely irrigated and there is no retained cyst or active bleeding at the end of the procedure.  The resulting cavity was closed in layers. A deep interrupted 2-0 Vicryl suture was used to obliterate the space. A combination of vertical mattress and simple interrupted 2-0 nylon sutures were used to reapproximate the skin. Bacitracin was placed over the suture line and a sterile dressing of ABDs pad and mesh underpants was  placed over this.  Patient was returned to the supine position where he was awoken from general tracheal anesthesia in the right no immediate, complications and all counts are correct and the procedure.  Findings: Pilonidal cyst   Estimated Blood Loss: 20 mL         Drains: None         Specimens: Pilonidal cyst          Complications: None                  Condition: Good   Samuel Frameharles Zayon Trulson, MD, FACS

## 2015-09-06 NOTE — Interval H&P Note (Signed)
History and Physical Interval Note:  09/06/2015 6:50 AM  Samuel Miranda  has presented today for surgery, with the diagnosis of pilonidal cyst  The various methods of treatment have been discussed with the patient and family. After consideration of risks, benefits and other options for treatment, the patient has consented to  Procedure(s): CYST EXCISION PILONIDAL EXTENSIVE (N/A) as a surgical intervention .  The patient's history has been reviewed, patient examined, no change in status, stable for surgery.  I have reviewed the patient's chart and labs.  Questions were answered to the patient's satisfaction.     [name]

## 2015-09-07 LAB — SURGICAL PATHOLOGY

## 2015-09-13 ENCOUNTER — Encounter: Payer: Self-pay | Admitting: General Surgery

## 2015-09-13 ENCOUNTER — Ambulatory Visit (INDEPENDENT_AMBULATORY_CARE_PROVIDER_SITE_OTHER): Payer: 59 | Admitting: General Surgery

## 2015-09-13 VITALS — BP 147/92 | HR 102 | Temp 98.1°F | Ht 65.0 in | Wt 234.2 lb

## 2015-09-13 DIAGNOSIS — Z4889 Encounter for other specified surgical aftercare: Secondary | ICD-10-CM

## 2015-09-13 MED ORDER — OXYCODONE-ACETAMINOPHEN 5-325 MG PO TABS: 1.0000 | ORAL_TABLET | Freq: Four times a day (QID) | ORAL | Status: DC | PRN

## 2015-09-13 NOTE — Progress Notes (Signed)
Outpatient Surgical Follow Up  09/13/2015  Samuel Miranda is an 20 y.o. male.   Chief Complaint  Patient presents with  . Routine Post Op    Pilonidal Cyst    HPI: 20 year old male returns to clinic for follow-up 1 week status post pilonidal cystectomy. Patient reports having a little bit of drainage about once a day. He denies any fevers, chills, nausea, vomiting, diarrhea, consultation. He is having intermittent discomfort from the area and is continuing to me pain meds especially at night for rest. Thus far he's been very happy with his surgical experience.  Past Medical History  Diagnosis Date  . Pilonidal cyst 2016  . Hay fever   . GERD (gastroesophageal reflux disease)     As a child  . History of learning disability     From childhood events- low oxygenation for short periods of time  . Headache     Past Surgical History  Procedure Laterality Date  . 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 Cystectomy  2014    cyst Removed from bladder  . Tracheostomy      AGE 20 DUE ACID REFLUX PER PT  . Pilonidal cyst excision N/A 09/06/2015    Procedure: CYST EXCISION PILONIDAL EXTENSIVE;  Surgeon: Ricarda Frameharles Whitley Patchen, MD;  Location: [facility] ORS;  Service: General;  Laterality: N/A;    Family History  Problem Relation Age of Onset  . Asthma Mother   . Diabetes Mother   . Hyperlipidemia Mother   . Hypertension Mother   . Alcohol abuse Father   . Alcohol abuse Maternal Grandmother   . COPD Maternal Grandmother   . Cancer Maternal Aunt 2052    Breast Cancer  . Diabetes Maternal Uncle   . COPD Maternal Grandfather   . Hypertension Maternal Grandfather   . Diabetes Paternal Grandmother   . Stroke Paternal Grandmother   . Heart disease Paternal Grandfather   . COPD Paternal Grandfather     Social History:  reports that he has never smoked. He has never used smokeless tobacco. He reports that he does not drink alcohol or use illicit drugs.  Allergies:  Allergies  Allergen  Reactions  . Penicillins Shortness Of Breath and Rash  . Augmentin [Amoxicillin-Pot Clavulanate] Nausea And Vomiting  . Keflex [Cephalexin] Nausea And Vomiting    Medications reviewed.    ROS A multipoint review of systems was completed, all pertinent positives and negatives are documented within the history of present illness and remainder are negative.   BP 147/92 mmHg  Pulse 102  Temp(Src) 98.1 F (36.7 C) (Oral)  Ht 5\' 5"  (1.651 m)  Wt 106.232 kg (234 lb 3.2 oz)  BMI 38.97 kg/m2  Physical Exam  Gen.: No acute distress Chest: Clear to sedation Heart: Tachycardic Abdomen: Soft and nontender Rectal: Patent mouth cystectomy site visualized with sutures in place. No evidence of erythema or purulence. There is some expressible serosanguineous fluid.   No results found for this or any previous visit (from the past 48 hour(s)). No results found.  Assessment/Plan:  1. Aftercare following surgery 20 year old male 1 week status post pilonidal cystectomy. Doing well. Discussed with the patient and his mother to keep the area covered until the drainage stops. Discussed signs and symptoms of infection and to report to clinic immediately should they occur. Otherwise, an additional prescription for pain medications was provided today and he'll follow-up in clinic approximately 2 weeks for additional wound check and suture removal.  Ricarda Frame, MD FACS General Surgeon  09/13/2015,2:45 PM

## 2015-09-13 NOTE — Patient Instructions (Signed)
Please see your appointment listed below. 

## 2015-09-14 ENCOUNTER — Encounter: Payer: 59 | Admitting: General Surgery

## 2015-09-24 ENCOUNTER — Ambulatory Visit (INDEPENDENT_AMBULATORY_CARE_PROVIDER_SITE_OTHER): Payer: 59 | Admitting: General Surgery

## 2015-09-24 ENCOUNTER — Encounter: Payer: Self-pay | Admitting: General Surgery

## 2015-09-24 VITALS — BP 141/86 | HR 103 | Temp 98.4°F | Ht 65.0 in | Wt 238.0 lb

## 2015-09-24 DIAGNOSIS — Z4889 Encounter for other specified surgical aftercare: Secondary | ICD-10-CM

## 2015-09-24 NOTE — Patient Instructions (Signed)
Please give us a call if you develop a fever, redness on your incision area.

## 2015-09-24 NOTE — Progress Notes (Signed)
Outpatient Surgical Follow Up  09/24/2015  Samuel Miranda is an 20 y.o. male.   Chief Complaint  Patient presents with  . Routine Post Op    Pilonidal Cystectomy 09/06/2015 Dr. Tonita CongWoodham    HPI: 20 year old male returns to clinic 3 weeks status post pilonidal cystectomy. Patient reports he is continued to have some drainage from the top of his incision but it is decreasing daily. He is having minimal discomfort from the area. He denies any fevers, chills, nausea, vomiting, chest pain, shortness of breath, diarrhea, constipation. He is very happy with his surgical result as far. He desires to have sutures removed at they're ready.  Past Medical History  Diagnosis Date  . Pilonidal cyst 2016  . Hay fever   . GERD (gastroesophageal reflux disease)     As a child  . History of learning disability     From childhood events- low oxygenation for short periods of time  . Headache     Past Surgical History  Procedure Laterality Date  . 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 Cystectomy  2014    cyst Removed from bladder  . Tracheostomy      AGE 2 DUE ACID REFLUX PER PT  . Pilonidal cyst excision N/A 09/06/2015    Procedure: CYST EXCISION PILONIDAL EXTENSIVE;  Surgeon: Ricarda Frameharles Lupe Handley, MD;  Location: [facility] ORS;  Service: General;  Laterality: N/A;    Family History  Problem Relation Age of Onset  . Asthma Mother   . Diabetes Mother   . Hyperlipidemia Mother   . Hypertension Mother   . Alcohol abuse Father   . Alcohol abuse Maternal Grandmother   . COPD Maternal Grandmother   . Cancer Maternal Aunt [id number]    Breast Cancer  . Diabetes Maternal Uncle   . COPD Maternal Grandfather   . Hypertension Maternal Grandfather   . Diabetes Paternal Grandmother   . Stroke Paternal Grandmother   . Heart disease Paternal Grandfather   . COPD Paternal Grandfather     Social History:  reports that he has never smoked. He has never used smokeless tobacco. He reports that he does not drink  alcohol or use illicit drugs.  Allergies:  Allergies  Allergen Reactions  . Penicillins Shortness Of Breath and Rash  . Augmentin [Amoxicillin-Pot Clavulanate] Nausea And Vomiting  . Keflex [Cephalexin] Nausea And Vomiting    Medications reviewed.    ROS A multipoint review of systems was completed. All pertinent positives and negatives are documented within the history of present illness and remainder are negative.   BP 141/86 mmHg  Pulse 103  Temp(Src) 98.4 F (36.9 C) (Oral)  Ht 5\' 5"  (1.651 m)  Wt 107.956 kg (238 lb)  BMI 39.61 kg/m2  Physical Exam  Gen.: No acute distress Chest: Clear to all fixation Heart: Tachycardic Abdomen: Soft and nontender Rectum: Pilonidal cystectomy site with sutures in place. A 2 mm opening in the most cephalad portion of the incision. The most distal 1 cm aspect of the incision site without good skin approximation.   No results found for this or any previous visit (from the past 48 hour(s)). No results found.  Assessment/Plan:  1. Aftercare following surgery 20 year old male status post cholecystectomy. Majority of sutures removed today and replaced with Steri-Strips. The distal aspect left in place for an additional week. Patient to follow-up in clinic next week for additional wound check and likely suture removal. Signs and symptoms of infection again went over with  the patient and his mother who voiced understanding.     Ricarda Frame, MD FACS General Surgeon  09/24/2015,4:20 PM

## 2015-10-04 ENCOUNTER — Encounter: Payer: 59 | Admitting: Surgery

## 2015-10-04 ENCOUNTER — Ambulatory Visit: Payer: Self-pay | Admitting: Family Medicine

## 2015-10-10 ENCOUNTER — Ambulatory Visit: Payer: Self-pay | Admitting: Surgery

## 2015-10-10 ENCOUNTER — Encounter: Payer: Self-pay | Admitting: Surgery

## 2015-10-10 ENCOUNTER — Ambulatory Visit (INDEPENDENT_AMBULATORY_CARE_PROVIDER_SITE_OTHER): Payer: 59 | Admitting: Surgery

## 2015-10-10 VITALS — BP 133/85 | HR 101 | Temp 98.3°F | Ht 65.0 in | Wt 237.6 lb

## 2015-10-10 DIAGNOSIS — L0591 Pilonidal cyst without abscess: Secondary | ICD-10-CM

## 2015-10-10 NOTE — Patient Instructions (Signed)
If you continue to have drainage in 2 weeks call the office and we will make you a follow up appointment, otherwise we will see you on an as needed basis.

## 2015-10-10 NOTE — Progress Notes (Signed)
[age]yr old male s/p Pilonidal cyst excision on 5/25 with Dr. Tonita CongWoodham.  He has had some drainage but had improvement this week.  He denies pain, states he is eating well and having good BMs.   Filed Vitals:   10/10/15 1426  BP: 133/85  Pulse: 101  Temp: 98.3 F (36.8 C)   PE:  GEn: NAD Gluteal cleft incision:   Healing well with pinpoint serous drainage at most superior portion of wound, no erythema, no other drainage, inferior stitches removed  A/P: healing well from pilonidal excisions, remaining sutures removed today, continue to keep wound dry and drainage should cease in the next couple weeks.  He is to call with any issues or questions.

## 2016-04-14 ENCOUNTER — Ambulatory Visit
Admission: EM | Admit: 2016-04-14 | Discharge: 2016-04-14 | Disposition: A | Discharge status: Home / Self Care | Payer: 59 | Attending: Emergency Medicine | Admitting: Emergency Medicine

## 2016-04-14 DIAGNOSIS — J069 Acute upper respiratory infection, unspecified: Secondary | ICD-10-CM | POA: Diagnosis not present

## 2016-04-14 DIAGNOSIS — H65192 Other acute nonsuppurative otitis media, left ear: Secondary | ICD-10-CM

## 2016-04-14 MED ORDER — AZITHROMYCIN 250 MG PO TABS: 250.0000 mg | ORAL_TABLET | Freq: Every day | ORAL | 0 refills | Status: AC

## 2016-04-14 NOTE — ED Provider Notes (Signed)
CSN: [id number]     Arrival date & time 04/14/16  1012 History   First MD Initiated Contact with Patient 04/14/16 1142     Chief Complaint  Patient presents with  . Cough   (Consider location/radiation/quality/duration/timing/severity/associated sxs/prior Treatment) Patient is a 21 year old male, presents today for 6 day history of cold symptoms. Symptoms consist of congestion, ear pain, runny nose, sinus pressure/pain, sneezing, sore throat and coughing. Patient reports that. Patient reports the ear pain to be on the left side. Patient reports the severity of his symptoms to have been unchanged. Patient has tried several over-the-counter medications with no significant relief.      Past Medical History:  Diagnosis Date  . GERD (gastroesophageal reflux disease)    As a child  . Hay fever   . Headache   . History of learning disability    From childhood events- low oxygenation for short periods of time  . Pilonidal cyst 2016   Past Surgical History:  Procedure Laterality Date  . CYSTECTOMY  2012   Bladder tumor removed  . CYSTECTOMY  2014   cyst Removed from bladder  . PILONIDAL CYST EXCISION N/A 09/06/2015   Procedure: CYST EXCISION PILONIDAL EXTENSIVE;  Surgeon: Ricarda Frameharles Woodham, MD;  Location: [facility] ORS;  Service: General;  Laterality: N/A;  . TRACHEOSTOMY     AGE 21 DUE ACID REFLUX PER PT   Family History  Problem Relation Age of Onset  . Asthma Mother   . Diabetes Mother   . Hyperlipidemia Mother   . Hypertension Mother   . Alcohol abuse Father   . Alcohol abuse Maternal Grandmother   . COPD Maternal Grandmother   . Cancer Maternal Aunt [id number]    Breast Cancer  . Diabetes Maternal Uncle   . COPD Maternal Grandfather   . Hypertension Maternal Grandfather   . Diabetes Paternal Grandmother   . Stroke Paternal Grandmother   . Heart disease Paternal Grandfather   . COPD Paternal Grandfather    Social History  Substance Use Topics  . Smoking status: Never Smoker  .  Smokeless tobacco: Never Used  . Alcohol use No    Review of Systems  HENT: Positive for congestion, ear pain, rhinorrhea, sinus pain, sinus pressure, sneezing and sore throat.   Respiratory: Positive for cough. Negative for shortness of breath and wheezing.   Cardiovascular: Negative for chest pain and palpitations.  Gastrointestinal: Negative for abdominal pain, nausea and vomiting.  Musculoskeletal: Negative for myalgias.  Skin: Negative for rash.  Neurological: Negative for dizziness and headaches.    Allergies  Penicillins; Augmentin [amoxicillin-pot clavulanate]; Keflex [cephalexin]; and Levaquin [levofloxacin in d5w]  Home Medications   Prior to Admission medications   Medication Sig Start Date End Date Taking? Authorizing Provider  azithromycin (ZITHROMAX) 250 MG tablet Take 1 tablet (250 mg total) by mouth daily. Take first 2 tablets together, then 1 every day until finished. 04/14/16 04/19/16  Lucia EstelleFeng Wynton Hufstetler, NP  cetirizine (ZYRTEC) 10 MG tablet Take 10 mg by mouth as needed for allergies.    Historical Provider, MD  oxyCODONE-acetaminophen (ROXICET) 5-325 MG tablet Take 1 tablet by mouth every 6 (six) hours as needed for severe pain. 09/13/15   Ricarda Frameharles Woodham, MD   Meds Ordered and Administered this Visit  Medications - No data to display  BP (!) 145/83 (BP Location: Left Arm)   Pulse (!) 108   Temp 98.5 F (36.9 C) (Oral)   Resp 17   Ht 5\' 6"  (1.676 m)   Wt  235 lb (106.6 kg)   SpO2 98%   BMI 37.93 kg/m  No data found.   Physical Exam  Constitutional: He is oriented to person, place, and time. He appears well-developed and well-nourished.  HENT:  Head: Normocephalic and atraumatic.  Right Ear: External ear normal.  Left Ear: External ear normal.  Nose: Nose normal.  Pharynx and tonsils are unremarkable. Left TM is slightly erythematous without bulging or perforation.  Eyes: EOM are normal. Pupils are equal, round, and reactive to light.  Neck: Normal range of  motion.  Cardiovascular: Normal rate, regular rhythm and normal heart sounds.   Pulmonary/Chest: Effort normal and breath sounds normal.  Abdominal: Soft. Bowel sounds are normal. He exhibits no distension. There is no tenderness.  Lymphadenopathy:    He has no cervical adenopathy.  Neurological: He is alert and oriented to person, place, and time.  Skin: Skin is warm and dry.  Nursing note and vitals reviewed.   Urgent Care Course   Clinical Course     Procedures (including critical care time)  Labs Review Labs Reviewed - No data to display  Imaging Review No results found.   MDM   1. Other acute nonsuppurative otitis media of left ear, recurrence not specified   2. Upper respiratory tract infection, unspecified type    There is a concern for otitis media. Patient's heart rate is 108 today. We'll treat empirically with Z-Pak. Patient is allergic to penicillin. Reviewed directions for usage and side effects. Patient informed to follow with PCP in 1 week for re-evaluation. Discharge instruction given. Patient denies any questions.    Lucia Estelle, NP 04/14/16 1155

## 2016-04-14 NOTE — ED Triage Notes (Signed)
Patient complains of cough and congestion with sneezing. Patient reports that he when he coughs he noticed some abdominal pain. Patient reports some left ear discomfort.

## 2017-03-27 DIAGNOSIS — H5213 Myopia, bilateral: Secondary | ICD-10-CM | POA: Diagnosis not present

## 2018-04-07 ENCOUNTER — Encounter: Payer: Self-pay | Admitting: Emergency Medicine

## 2018-04-07 ENCOUNTER — Other Ambulatory Visit: Payer: Self-pay

## 2018-04-07 ENCOUNTER — Emergency Department
Admission: EM | Admit: 2018-04-07 | Discharge: 2018-04-07 | Disposition: A | Discharge status: Home / Self Care | Payer: 59 | Attending: Emergency Medicine | Admitting: Emergency Medicine

## 2018-04-07 DIAGNOSIS — J111 Influenza due to unidentified influenza virus with other respiratory manifestations: Secondary | ICD-10-CM | POA: Insufficient documentation

## 2018-04-07 DIAGNOSIS — R05 Cough: Secondary | ICD-10-CM | POA: Insufficient documentation

## 2018-04-07 DIAGNOSIS — M7918 Myalgia, other site: Secondary | ICD-10-CM | POA: Insufficient documentation

## 2018-04-07 DIAGNOSIS — R509 Fever, unspecified: Secondary | ICD-10-CM | POA: Insufficient documentation

## 2018-04-07 MED ORDER — PSEUDOEPH-BROMPHEN-DM 30-2-10 MG/5ML PO SYRP: 10.0000 mL | ORAL_SOLUTION | Freq: Four times a day (QID) | ORAL | 0 refills | Status: DC | PRN

## 2018-04-07 MED ORDER — IBUPROFEN 600 MG PO TABS
600.0000 mg | ORAL_TABLET | Freq: Once | ORAL | Status: AC
Start: 2018-04-07 — End: 2018-04-07
  Administered 2018-04-07: 600 mg via ORAL
  Filled 2018-04-07: qty 1

## 2018-04-07 NOTE — ED Notes (Signed)
AAOx3.  Skin warm and dry. nAd

## 2018-04-07 NOTE — ED Provider Notes (Signed)
[facility] Emergency Department Provider Note  ____________________________________________  Time seen: Approximately 4:25 PM  I have reviewed the triage vital signs and the nursing notes.   HISTORY  Chief Complaint Cough and Generalized Body Aches    HPI Samuel Miranda is a 22 y.o. male who presents the emergency department with 2-day history of nasal congestion, cough, body aches, fever.  Patient reports that symptoms hit right at 5 PM.  Patient has not taken any medication for his complaint.  He does not denies any headache, neck pain or stiffness, chest pain, abdominal pain, nausea vomiting, diarrhea or constipation.  Patient denies any shortness of breath.    Past Medical History:  Diagnosis Date  . GERD (gastroesophageal reflux disease)    As a child  . Hay fever   . Headache   . History of learning disability    From childhood events- low oxygenation for short periods of time  . Pilonidal cyst 2016    Patient Active Problem List   Diagnosis Date Noted  . Encounter for general adult medical examination with abnormal findings 09/03/2015  . Cough 09/03/2015  . Swelling of extremities 09/03/2015  . Abdominal cramping 09/03/2015  . Encounter to establish care 06/21/2014    Past Surgical History:  Procedure Laterality Date  . CYSTECTOMY  2012   Bladder tumor removed  . CYSTECTOMY  2014   cyst Removed from bladder  . PILONIDAL CYST EXCISION N/A 09/06/2015   Procedure: CYST EXCISION PILONIDAL EXTENSIVE;  Surgeon: Ricarda Frameharles Woodham, MD;  Location: [facility] ORS;  Service: General;  Laterality: N/A;  . TRACHEOSTOMY     AGE 35 DUE ACID REFLUX PER PT    Prior to Admission medications   Medication Sig Start Date End Date Taking? Authorizing Provider  brompheniramine-pseudoephedrine-DM 30-2-10 MG/5ML syrup Take 10 mLs by mouth 4 (four) times daily as needed. 04/07/18   Aulden Calise, Delorise RoyalsJonathan D, PA-C  cetirizine (ZYRTEC) 10 MG tablet Take 10 mg by mouth as  needed for allergies.    [provider]  oxyCODONE-acetaminophen (ROXICET) 5-325 MG tablet Take 1 tablet by mouth every 6 (six) hours as needed for severe pain. 09/13/15   Ricarda FrameWoodham, Charles, MD    Allergies Penicillins; Augmentin [amoxicillin-pot clavulanate]; Keflex [cephalexin]; and Levaquin [levofloxacin in d5w]  Family History  Problem Relation Age of Onset  . Asthma Mother   . Diabetes Mother   . Hyperlipidemia Mother   . Hypertension Mother   . Alcohol abuse Father   . Alcohol abuse Maternal Grandmother   . COPD Maternal Grandmother   . Cancer Maternal Aunt 4052       Breast Cancer  . Diabetes Maternal Uncle   . COPD Maternal Grandfather   . Hypertension Maternal Grandfather   . Diabetes Paternal Grandmother   . Stroke Paternal Grandmother   . Heart disease Paternal Grandfather   . COPD Paternal Grandfather     Social History Social History   Tobacco Use  . Smoking status: Never Smoker  . Smokeless tobacco: Never Used  Substance Use Topics  . Alcohol use: No    Alcohol/week: 0.0 standard drinks  . Drug use: No     Review of Systems  Constitutional: Positive fever/chills Eyes: No visual changes. No discharge ENT: Positive for nasal congestion Cardiovascular: no chest pain. Respiratory: Positive cough. No SOB. Gastrointestinal: No abdominal pain.  No nausea, no vomiting.  No diarrhea.  No constipation. Musculoskeletal: Negative for musculoskeletal pain. Skin: Negative for rash, abrasions, lacerations, ecchymosis. Neurological: Negative  for headaches, focal weakness or numbness. 10-point ROS otherwise negative.  ____________________________________________   PHYSICAL EXAM:  VITAL SIGNS: ED Triage Vitals  Enc Vitals Group     BP 04/07/18 1440 128/90     Pulse Rate 04/07/18 1440 (!) 130     Resp 04/07/18 1440 20     Temp 04/07/18 1440 (!) 103.1 F (39.5 C)     Temp Source 04/07/18 1440 Oral     SpO2 04/07/18 1440 95 %     Weight 04/07/18 1441  230 lb (104.3 kg)     Height 04/07/18 1441 5\' 6"  (1.676 m)     Head Circumference --      Peak Flow --      Pain Score 04/07/18 1441 9     Pain Loc --      Pain Edu? --      Excl. in GC? --      Constitutional: Alert and oriented. Well appearing and in no acute distress. Eyes: Conjunctivae are normal. PERRL. EOMI. Head: Atraumatic. ENT:      Ears: EACs unremarkable bilaterally.  TMs are mildly erythematous but nonedematous.      Nose: Moderate clear congestion/rhinnorhea.      Mouth/Throat: Mucous membranes are moist.  Oropharynx is nonerythematous and nonedematous.  Uvula is midline. Neck: No stridor.  Neck is supple full range of motion Hematological/Lymphatic/Immunilogical: Diffuse, mobile, nontender anterior cervical lymphadenopathy. Cardiovascular: Normal rate, regular rhythm. Normal S1 and S2.  Good peripheral circulation. Respiratory: Normal respiratory effort without tachypnea or retractions. Lungs CTAB. Good air entry to the bases with no decreased or absent breath sounds. Musculoskeletal: Full range of motion to all extremities. No gross deformities appreciated. Neurologic:  Normal speech and language. No gross focal neurologic deficits are appreciated.  Skin:  Skin is warm, dry and intact. No rash noted. Psychiatric: Mood and affect are normal. Speech and behavior are normal. Patient exhibits appropriate insight and judgement.   ____________________________________________   LABS (all labs ordered are listed, but only abnormal results are displayed)  Labs Reviewed - No data to display ____________________________________________  EKG   ____________________________________________  RADIOLOGY   No results found.  ____________________________________________    PROCEDURES  Procedure(s) performed:    Procedures    Medications  ibuprofen (ADVIL,MOTRIN) tablet 600 mg (600 mg Oral Given 04/07/18 1448)      ____________________________________________   INITIAL IMPRESSION / ASSESSMENT AND PLAN / ED COURSE  Pertinent labs & imaging results that were available during my care of the patient were reviewed by me and considered in my medical decision making (see chart for details).  Review of the [place] CSRS was performed in accordance of the [facility] prior to dispensing any controlled drugs.      Patient's diagnosis is consistent with influenza.  Patient presents emergency department with sudden onset of nasal congestion, cough, body aches and fever.  Initially, patient presented with 103.1 F temperature with a pulse rate of 130.  While vital signs are concerning for sepsis, patient symptoms are consistent with influenza.  After administration of Motrin, on my arrival into the the patient's room, his heart rate was 98.  At this time, patient's symptoms are most consistent with influenza.  I have offered testing as well as Tamiflu.  After discussion with the patient and his mother who has similar symptoms, they opted for symptom control medications of Tylenol, Motrin at home with drinking plenty of fluids and plenty of rest.  Follow-up with primary care as needed..  Patient  is given ED precautions to return to the ED for any worsening or new symptoms.     ____________________________________________  FINAL CLINICAL IMPRESSION(S) / ED DIAGNOSES  Final diagnoses:  Influenza      NEW MEDICATIONS STARTED DURING THIS VISIT:  ED Discharge Orders         Ordered    brompheniramine-pseudoephedrine-DM 30-2-10 MG/5ML syrup  4 times daily PRN     04/07/18 1629              This chart was dictated using voice recognition software/Dragon. Despite best efforts to proofread, errors can occur which can change the meaning. Any change was purely unintentional.    Racheal PatchesCuthriell, Jazmina Muhlenkamp D, PA-C 04/07/18 1629    Sharman CheekStafford, Phillip, MD 04/07/18 2356

## 2018-04-07 NOTE — ED Triage Notes (Signed)
Cough and body ache since yesterday..Samuel Miranda

## 2019-03-28 ENCOUNTER — Other Ambulatory Visit: Payer: Self-pay

## 2019-03-28 DIAGNOSIS — Z20822 Contact with and (suspected) exposure to covid-19: Secondary | ICD-10-CM

## 2019-03-29 LAB — NOVEL CORONAVIRUS, NAA: SARS-CoV-2, NAA: NOT DETECTED

## 2019-07-23 ENCOUNTER — Ambulatory Visit: Payer: Self-pay | Attending: Internal Medicine

## 2019-07-23 DIAGNOSIS — Z23 Encounter for immunization: Secondary | ICD-10-CM

## 2019-07-23 NOTE — Progress Notes (Signed)
   Covid-19 Vaccination Clinic  Name:  GERRIT RAFALSKI    MRN: [id number] DOB: [birth year]  07/23/2019  Mr. Herbers was observed post Covid-19 immunization for 15 minutes without incident. He was provided with Vaccine Information Sheet and instruction to access the V-Safe system.   Mr. Ferrari was instructed to call 911 with any severe reactions post vaccine: Marland Kitchen Difficulty breathing  . Swelling of face and throat  . A fast heartbeat  . A bad rash all over body  . Dizziness and weakness   Immunizations Administered    Name Date Dose VIS Date Route   Pfizer COVID-19 Vaccine 07/23/2019 12:07 PM 0.3 mL 03/25/2019 Intramuscular   Manufacturer: [employer], [employer]   Lot: [id number]   NDC: [address]-1

## 2019-08-17 ENCOUNTER — Ambulatory Visit: Payer: Self-pay | Attending: Internal Medicine

## 2019-08-17 ENCOUNTER — Other Ambulatory Visit: Payer: Self-pay

## 2019-08-17 DIAGNOSIS — Z23 Encounter for immunization: Secondary | ICD-10-CM

## 2019-08-17 NOTE — Progress Notes (Signed)
   Covid-19 Vaccination Clinic  Name:  VAUGHAN GARFINKLE    MRN: [id number] DOB: 02-17-1996  08/17/2019  Mr. Bohanon was observed post Covid-19 immunization for 15 minutes without incident. He was provided with Vaccine Information Sheet and instruction to access the V-Safe system.   Mr. Viviano was instructed to call 911 with any severe reactions post vaccine: Marland Kitchen Difficulty breathing  . Swelling of face and throat  . A fast heartbeat  . A bad rash all over body  . Dizziness and weakness   Immunizations Administered    Name Date Dose VIS Date Route   Pfizer COVID-19 Vaccine 08/17/2019  2:07 PM 0.3 mL 06/08/2018 Intramuscular   Manufacturer: [employer], [employer]   Lot: [id number]   NDC: [address]-2

## 2020-05-10 ENCOUNTER — Other Ambulatory Visit: Payer: Self-pay

## 2020-05-10 DIAGNOSIS — Z20822 Contact with and (suspected) exposure to covid-19: Secondary | ICD-10-CM

## 2020-05-11 LAB — NOVEL CORONAVIRUS, NAA: SARS-CoV-2, NAA: DETECTED — AB

## 2020-05-11 LAB — SARS-COV-2, NAA 2 DAY TAT

## 2020-07-06 ENCOUNTER — Ambulatory Visit: Payer: Self-pay | Admitting: Family Medicine

## 2020-08-03 ENCOUNTER — Ambulatory Visit: Payer: Self-pay | Admitting: Family Medicine

## 2020-08-03 DIAGNOSIS — Z0289 Encounter for other administrative examinations: Secondary | ICD-10-CM

## 2023-10-23 ENCOUNTER — Ambulatory Visit
Admission: EM | Admit: 2023-10-23 | Discharge: 2023-10-23 | Disposition: A | Discharge status: Home / Self Care | Payer: Self-pay | Attending: Emergency Medicine | Admitting: Emergency Medicine

## 2023-10-23 ENCOUNTER — Encounter: Payer: Self-pay | Admitting: Emergency Medicine

## 2023-10-23 DIAGNOSIS — L247 Irritant contact dermatitis due to plants, except food: Secondary | ICD-10-CM

## 2023-10-23 MED ORDER — PREDNISONE 10 MG (21) PO TBPK: ORAL_TABLET | Freq: Every day | ORAL | 0 refills | Status: AC

## 2023-10-23 MED ORDER — DEXAMETHASONE SODIUM PHOSPHATE 10 MG/ML IJ SOLN
10.0000 mg | Freq: Once | INTRAMUSCULAR | Status: AC
  Administered 2023-10-23: 10 mg via INTRAMUSCULAR

## 2023-10-23 NOTE — Discharge Instructions (Addendum)
Take the prednisone according to the package instructions.  Use over-the-counter Allegra, Claritin, or Zyrtec during the day as needed for itching and use Benadryl 50 mg at bedtime.  This may also help you sleep as a steroids may interrupt your sleep cycle.  Apply calamine lotion to the rash on your extremities to help dry it up.  Do not use calamine lotion on your face.  For facial lesions, if you develop any changes in your vision or itching and irritation in your eyes please go to the ER for evaluation or follow-up with ophthalmology.  

## 2023-10-23 NOTE — ED Triage Notes (Signed)
 Pt states he has poison ivy on his face, lower legs, bilateral arms, and hands. He states he recently did yard work. Started about 3-4 days ago.

## 2023-10-23 NOTE — ED Provider Notes (Signed)
 [facility]    CSN: [id number] Arrival date & time: 10/23/23  1002      History   Chief Complaint Chief Complaint  Patient presents with   Rash    HPI Samuel Miranda is a 28 y.o. male.   HPI  28 year old male with past medical history significant for learning disability, GERD, hayfever, bilateral cysts presents for evaluation of poison ivy exposure.  He has got a rash on his face, both lower legs, both arms, and both hands.  He came in contact while doing yard work 3 to 4 days ago.  He has been using calamine lotion at home without improvement of symptoms.  He denies any respiratory symptoms.  Past Medical History:  Diagnosis Date   GERD (gastroesophageal reflux disease)    As a child   Hay fever    Headache    History of learning disability    From childhood events- low oxygenation for short periods of time   Pilonidal cyst 2016    Patient Active Problem List   Diagnosis Date Noted   Encounter for general adult medical examination with abnormal findings 09/03/2015   Cough 09/03/2015   Swelling of extremities 09/03/2015   Abdominal cramping 09/03/2015   Encounter to establish care 06/21/2014    Past Surgical History:  Procedure Laterality Date   BACK SURGERY     CYSTECTOMY  04/14/2010   Bladder tumor removed   CYSTECTOMY  04/14/2012   cyst Removed from bladder   PILONIDAL CYST EXCISION N/A 09/06/2015   Procedure: CYST EXCISION PILONIDAL EXTENSIVE;  Surgeon: Carlin Pastel, MD;  Location: [facility] ORS;  Service: General;  Laterality: N/A;   TRACHEOSTOMY     AGE 73 DUE ACID REFLUX PER PT       Home Medications    Prior to Admission medications   Medication Sig Start Date End Date Taking? Authorizing Provider  predniSONE  (STERAPRED UNI-PAK 21 TAB) 10 MG (21) TBPK tablet Take by mouth daily. Take 6 tabs by mouth daily  for 2 days, then 5 tabs for 2 days, then 4 tabs for 2 days, then 3 tabs for 2 days, 2 tabs for 2 days, then 1 tab by mouth daily for  2 days 10/23/23  Yes Bernardino Ditch, NP    Family History Family History  Problem Relation Age of Onset   Asthma Mother    Diabetes Mother    Hyperlipidemia Mother    Hypertension Mother    Alcohol abuse Father    Alcohol abuse Maternal Grandmother    COPD Maternal Grandmother    Cancer Maternal Aunt 52       Breast Cancer   Diabetes Maternal Uncle    COPD Maternal Grandfather    Hypertension Maternal Grandfather    Diabetes Paternal Grandmother    Stroke Paternal Grandmother    Heart disease Paternal Grandfather    COPD Paternal Grandfather     Social History Social History   Tobacco Use   Smoking status: Never   Smokeless tobacco: Never  Vaping Use   Vaping status: Never Used  Substance Use Topics   Alcohol use: No    Alcohol/week: 0.0 standard drinks of alcohol   Drug use: No     Allergies   Penicillins, Augmentin [amoxicillin-pot clavulanate], Keflex [cephalexin], and Levaquin [levofloxacin in d5w]   Review of Systems Review of Systems  Constitutional:  Negative for fever.  HENT:  Negative for trouble swallowing and voice change.   Respiratory:  Negative for shortness  of breath.   Skin:  Positive for rash.     Physical Exam Triage Vital Signs ED Triage Vitals  Encounter Vitals Group     BP 10/23/23 1020 125/88     Girls Systolic BP Percentile --      Girls Diastolic BP Percentile --      Boys Systolic BP Percentile --      Boys Diastolic BP Percentile --      Pulse Rate 10/23/23 1020 (!) 101     Resp 10/23/23 1020 16     Temp 10/23/23 1020 98.3 F (36.8 C)     Temp Source 10/23/23 1020 Oral     SpO2 10/23/23 1020 95 %     Weight 10/23/23 1018 229 lb 15 oz (104.3 kg)     Height 10/23/23 1018 5' 6 (1.676 m)     Head Circumference --      Peak Flow --      Pain Score 10/23/23 1018 0     Pain Loc --      Pain Education --      Exclude from Growth Chart --    No data found.  Updated Vital Signs BP 125/88 (BP Location: Right Arm)   Pulse (!)  101   Temp 98.3 F (36.8 C) (Oral)   Resp 16   Ht 5' 6 (1.676 m)   Wt 229 lb 15 oz (104.3 kg)   SpO2 95%   BMI 37.11 kg/m   Visual Acuity Right Eye Distance:   Left Eye Distance:   Bilateral Distance:    Right Eye Near:   Left Eye Near:    Bilateral Near:     Physical Exam Vitals and nursing note reviewed.  Constitutional:      Appearance: Normal appearance. He is not ill-appearing.  HENT:     Head: Normocephalic and atraumatic.  Skin:    General: Skin is warm and dry.     Capillary Refill: Capillary refill takes less than 2 seconds.     Findings: Erythema and rash present.  Neurological:     General: No focal deficit present.     Mental Status: He is alert and oriented to person, place, and time.      UC Treatments / Results  Labs (all labs ordered are listed, but only abnormal results are displayed) Labs Reviewed - No data to display  EKG   Radiology No results found.  Procedures Procedures (including critical care time)  Medications Ordered in UC Medications  dexamethasone  (DECADRON ) injection 10 mg (has no administration in time range)    Initial Impression / Assessment and Plan / UC Course  I have reviewed the triage vital signs and the nursing notes.  Pertinent labs & imaging results that were available during my care of the patient were reviewed by me and considered in my medical decision making (see chart for details).   Patient is a nontoxic-appearing 28 year old male presenting for evaluation of contact dermatitis from poison ivy exposure as outlined HPI above.            As you can see in images above, the rashes in various stages with the most significant rash appearing on the back of the right elbow and on the outside the right lower leg.  Tenderness, no warmth to the areas of erythema and they are not tender, indurated, or fluctuant.  I will treat the patient for contact dermatitis with a 10 mg injection of Decadron  here in clinic  and  have him start a 12-day prednisone  taper tomorrow morning.  We also discussed using dual antihistamine therapy to help with itching.  He may apply calamine lotion to all of his body rash but he should avoid putting it on his face.  Return precautions reviewed.   Final Clinical Impressions(s) / UC Diagnoses   Final diagnoses:  Irritant contact dermatitis due to plants, except food     Discharge Instructions      Take the prednisone  according to the package instructions.  Use over-the-counter Allegra, Claritin, or Zyrtec during the day as needed for itching and use Benadryl 50 mg at bedtime.  This may also help you sleep as a steroids may interrupt your sleep cycle.  Apply calamine lotion to the rash on your extremities to help dry it up.  Do not use calamine lotion on your face.  For facial lesions, if you develop any changes in your vision or itching and irritation in your eyes please go to the ER for evaluation or follow-up with ophthalmology.      ED Prescriptions     Medication Sig Dispense Auth. Provider   predniSONE  (STERAPRED UNI-PAK 21 TAB) 10 MG (21) TBPK tablet Take by mouth daily. Take 6 tabs by mouth daily  for 2 days, then 5 tabs for 2 days, then 4 tabs for 2 days, then 3 tabs for 2 days, 2 tabs for 2 days, then 1 tab by mouth daily for 2 days 42 tablet Bernardino Ditch, NP      PDMP not reviewed this encounter.   Bernardino Ditch, NP 10/23/23 1030
